# Patient Record
Sex: Female | Born: 1975
Health system: Southern US, Community
[De-identification: ages and names within clinical notes are randomized; demographics above are authoritative.]

## PROBLEM LIST (undated history)

## (undated) DIAGNOSIS — J302 Other seasonal allergic rhinitis: Secondary | ICD-10-CM

## (undated) DIAGNOSIS — I10 Essential (primary) hypertension: Secondary | ICD-10-CM

---

## 2001-11-04 ENCOUNTER — Encounter: Payer: Self-pay | Admitting: Emergency Medicine

## 2001-11-04 ENCOUNTER — Emergency Department (HOSPITAL_COMMUNITY): Admission: EM | Admit: 2001-11-04 | Discharge: 2001-11-04 | Payer: Self-pay | Admitting: Emergency Medicine

## 2004-03-02 ENCOUNTER — Emergency Department (HOSPITAL_COMMUNITY): Admission: EM | Admit: 2004-03-02 | Discharge: 2004-03-02 | Payer: Self-pay | Admitting: Emergency Medicine

## 2008-06-29 ENCOUNTER — Emergency Department (HOSPITAL_COMMUNITY): Admission: EM | Admit: 2008-06-29 | Discharge: 2008-06-29 | Payer: Self-pay | Admitting: Emergency Medicine

## 2011-02-06 ENCOUNTER — Emergency Department (HOSPITAL_COMMUNITY)
Admission: EM | Admit: 2011-02-06 | Discharge: 2011-02-07 | Disposition: A | Discharge status: Home / Self Care | Payer: Self-pay | Attending: Emergency Medicine | Admitting: Emergency Medicine

## 2011-02-06 DIAGNOSIS — Y92009 Unspecified place in unspecified non-institutional (private) residence as the place of occurrence of the external cause: Secondary | ICD-10-CM | POA: Insufficient documentation

## 2011-02-06 DIAGNOSIS — Y998 Other external cause status: Secondary | ICD-10-CM | POA: Insufficient documentation

## 2011-02-06 DIAGNOSIS — IMO0002 Reserved for concepts with insufficient information to code with codable children: Secondary | ICD-10-CM | POA: Insufficient documentation

## 2011-02-06 DIAGNOSIS — I10 Essential (primary) hypertension: Secondary | ICD-10-CM | POA: Insufficient documentation

## 2011-02-06 DIAGNOSIS — X58XXXA Exposure to other specified factors, initial encounter: Secondary | ICD-10-CM | POA: Insufficient documentation

## 2011-02-07 ENCOUNTER — Emergency Department (HOSPITAL_COMMUNITY): Payer: Self-pay

## 2011-07-06 LAB — URINALYSIS, ROUTINE W REFLEX MICROSCOPIC
Bilirubin Urine: NEGATIVE
Glucose, UA: NEGATIVE
Ketones, ur: NEGATIVE
Nitrite: NEGATIVE
Protein, ur: NEGATIVE
Specific Gravity, Urine: 1.025
Urobilinogen, UA: 0.2
pH: 7

## 2011-07-06 LAB — BASIC METABOLIC PANEL
BUN: 9
CO2: 25
Calcium: 9.2
Chloride: 106
Creatinine, Ser: 0.88
GFR calc Af Amer: >60
GFR calc non Af Amer: >60
Glucose, Bld: 120 — ABNORMAL HIGH
Potassium: 3.3 — ABNORMAL LOW
Sodium: 137

## 2011-07-06 LAB — URINE MICROSCOPIC-ADD ON

## 2011-09-16 ENCOUNTER — Encounter: Payer: Self-pay | Admitting: *Deleted

## 2011-09-16 ENCOUNTER — Emergency Department (HOSPITAL_COMMUNITY)
Admission: EM | Admit: 2011-09-16 | Discharge: 2011-09-16 | Disposition: A | Discharge status: Home / Self Care | Payer: No Typology Code available for payment source | Attending: Emergency Medicine | Admitting: Emergency Medicine

## 2011-09-16 DIAGNOSIS — M545 Low back pain, unspecified: Secondary | ICD-10-CM | POA: Insufficient documentation

## 2011-09-16 DIAGNOSIS — M542 Cervicalgia: Secondary | ICD-10-CM | POA: Insufficient documentation

## 2011-09-16 MED ORDER — IBUPROFEN 800 MG PO TABS
800.0000 mg | ORAL_TABLET | Freq: Once | ORAL | Status: AC
  Administered 2011-09-16: 800 mg via ORAL
  Filled 2011-09-16: qty 1
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
## 2011-09-16 NOTE — ED Notes (Signed)
A&ox4; in no distress; ambulatory with steady gait; medicated as ordered for pain; discharge instructions given and reviewed-verbalizes understanding of instructions given.

## 2015-04-22 ENCOUNTER — Encounter (HOSPITAL_COMMUNITY): Payer: Self-pay | Admitting: Emergency Medicine

## 2015-04-22 ENCOUNTER — Emergency Department (HOSPITAL_COMMUNITY)
Admission: EM | Admit: 2015-04-22 | Discharge: 2015-04-23 | Disposition: A | Discharge status: Home / Self Care | Payer: Self-pay | Attending: Emergency Medicine | Admitting: Emergency Medicine

## 2015-04-22 ENCOUNTER — Emergency Department (HOSPITAL_COMMUNITY): Payer: Self-pay

## 2015-04-22 DIAGNOSIS — K8050 Calculus of bile duct without cholangitis or cholecystitis without obstruction: Secondary | ICD-10-CM | POA: Insufficient documentation

## 2015-04-22 DIAGNOSIS — F419 Anxiety disorder, unspecified: Secondary | ICD-10-CM | POA: Insufficient documentation

## 2015-04-22 DIAGNOSIS — Z880 Allergy status to penicillin: Secondary | ICD-10-CM | POA: Insufficient documentation

## 2015-04-22 DIAGNOSIS — N390 Urinary tract infection, site not specified: Secondary | ICD-10-CM | POA: Insufficient documentation

## 2015-04-22 DIAGNOSIS — R0789 Other chest pain: Secondary | ICD-10-CM | POA: Insufficient documentation

## 2015-04-22 DIAGNOSIS — Z793 Long term (current) use of hormonal contraceptives: Secondary | ICD-10-CM | POA: Insufficient documentation

## 2015-04-22 DIAGNOSIS — Z3202 Encounter for pregnancy test, result negative: Secondary | ICD-10-CM | POA: Insufficient documentation

## 2015-04-22 DIAGNOSIS — R079 Chest pain, unspecified: Secondary | ICD-10-CM

## 2015-04-22 MED ORDER — MORPHINE SULFATE 4 MG/ML IJ SOLN
4.0000 mg | Freq: Once | INTRAMUSCULAR | Status: AC
  Administered 2015-04-23: 4 mg via INTRAVENOUS
  Filled 2015-04-22: qty 1

## 2015-04-22 MED ORDER — ONDANSETRON HCL 4 MG/2ML IJ SOLN
4.0000 mg | Freq: Once | INTRAMUSCULAR | Status: AC
  Administered 2015-04-23: 4 mg via INTRAVENOUS
  Filled 2015-04-22: qty 2

## 2015-04-22 NOTE — ED Notes (Signed)
Pt c/o cental chest pain that started at 2100 while she was watching tv.

## 2015-04-22 NOTE — ED Provider Notes (Signed)
CSN: [id number]     Arrival date & time 04/22/15  2330 History   First MD Initiated Contact with Patient 04/22/15 2337     Chief Complaint  Patient presents with  . Chest Pain     (Consider location/radiation/quality/duration/timing/severity/associated sxs/prior Treatment) HPI Patient presents with acute onset epigastric, central chest pain starting abruptly at 9:00 while watching television. Associated with nausea but no shortness of breath. No cough or fever. No lower extremity swelling or pain. Patient states the chest pain radiates to the right shoulder. It is not exacerbated by movement. Denies any trauma or heavy lifting. No previously similar episodes. Denied recent NSAID use. Patient states she has a family history of early myocardial infarction. States her father had an MI in his 26s. She denies smoking, hypertension or diabetes. Last ate barbecue and macaroni and cheese this evening. History reviewed. No pertinent past medical history. History reviewed. No pertinent past surgical history. History reviewed. No pertinent family history. History  Substance Use Topics  . Smoking status: Never Smoker   . Smokeless tobacco: Not on file  . Alcohol Use: No   OB History    No data available     Review of Systems  Constitutional: Negative for fever and chills.  Respiratory: Negative for cough and shortness of breath.   Cardiovascular: Positive for chest pain. Negative for leg swelling.  Gastrointestinal: Positive for nausea and abdominal pain. Negative for vomiting, diarrhea and constipation.  Musculoskeletal: Negative for myalgias, back pain, neck pain and neck stiffness.  Skin: Negative for rash and wound.  Neurological: Negative for dizziness, weakness, light-headedness, numbness and headaches.  All other systems reviewed and are negative.     Allergies  Penicillins  Home Medications   Prior to Admission medications   Medication Sig Start Date End Date Taking?  Authorizing Provider  desogestrel-ethinyl estradiol (APRI,EMOQUETTE,SOLIA) 0.15-30 MG-MCG tablet Take 1 tablet by mouth daily.      Historical Provider, MD  HYDROcodone-acetaminophen (NORCO/VICODIN) 5-325 MG per tablet Take 1-2 tablets by mouth every 4 (four) hours as needed for moderate pain or severe pain. 04/23/15   Loren Racer, MD  ondansetron (ZOFRAN) 4 MG tablet Take 1 tablet (4 mg total) by mouth every 6 (six) hours as needed for nausea or vomiting. 04/23/15   Loren Racer, MD   BP 120/76 mmHg  Pulse 59  Temp(Src) 97.7 F (36.5 C) (Oral)  Resp 15  Ht 5\' 8"  (1.727 m)  Wt 220 lb (99.791 kg)  BMI 33.46 kg/m2  SpO2 98%  LMP 03/26/2015 Physical Exam  Constitutional: She is oriented to person, place, and time. She appears well-developed and well-nourished. No distress.  Anxious appearing  HENT:  Head: Normocephalic and atraumatic.  Mouth/Throat: Oropharynx is clear and moist. No oropharyngeal exudate.  Eyes: EOM are normal. Pupils are equal, round, and reactive to light.  Neck: Normal range of motion. Neck supple.  Cardiovascular: Normal rate and regular rhythm.   Pulmonary/Chest: Effort normal and breath sounds normal. No respiratory distress. She has no wheezes. She has no rales. She exhibits tenderness (tender to palpation over the inferior mid sternum).  Abdominal: Soft. Bowel sounds are normal. She exhibits no distension and no mass. There is tenderness (right upper and epigastric tenderness with palpation.). There is no rebound and no guarding.  Musculoskeletal: Normal range of motion. She exhibits no edema or tenderness.  No CVA tenderness bilaterally. No lower extremity swelling or pain. Distal pulses intact and equal.  Neurological: She is alert and oriented to person,  place, and time.  Moves all extremities without deficit. Sensation is grossly intact.  Skin: Skin is warm and dry. No rash noted. No erythema.  Psychiatric: She has a normal mood and affect. Her behavior  is normal.  Nursing note and vitals reviewed.   ED Course  Procedures (including critical care time) Labs Review Labs Reviewed  CBC WITH DIFFERENTIAL/PLATELET - Abnormal; Notable for the following:    WBC 11.3 (*)    Lymphs Abs 4.3 (*)    All other components within normal limits  COMPREHENSIVE METABOLIC PANEL - Abnormal; Notable for the following:    Glucose, Bld 151 (*)    All other components within normal limits  URINALYSIS, ROUTINE W REFLEX MICROSCOPIC (NOT AT [facility]) - Abnormal; Notable for the following:    [facility] HAZY (*)    Hgb urine dipstick SMALL (*)    Leukocytes, UA MODERATE (*)    All other components within normal limits  D-DIMER, QUANTITATIVE (NOT [facility]) - Abnormal; Notable for the following:    D-Dimer, Quant 0.51 (*)    All other components within normal limits  URINE MICROSCOPIC-ADD ON - Abnormal; Notable for the following:    Squamous Epithelial / LPF MANY (*)    Bacteria, UA MANY (*)    All other components within normal limits  LIPASE, BLOOD  TROPONIN I  PREGNANCY, URINE  TROPONIN I    Imaging Review Dg Chest 2 View  04/23/2015   CLINICAL DATA:  39 year old female with chest pain  EXAM: CHEST  2 VIEW  COMPARISON:  None.  FINDINGS: The heart size and mediastinal contours are within normal limits. Both lungs are clear. The visualized skeletal structures are unremarkable.  IMPRESSION: No active cardiopulmonary disease.   Electronically Signed   By: Elgie CollardArash  Radparvar M.D.   On: 04/23/2015 01:29   Ct Angio Chest Pe W/cm &/or Wo Cm  04/23/2015   CLINICAL DATA:  39 year old female with chest pain  EXAM: CT ANGIOGRAPHY CHEST WITH CONTRAST  TECHNIQUE: Multidetector CT imaging of the chest was performed using the standard protocol during bolus administration of intravenous contrast. Multiplanar CT image reconstructions and MIPs were obtained to evaluate the vascular anatomy.  CONTRAST:  100mL OMNIPAQUE IOHEXOL 350 MG/ML SOLN  COMPARISON:  Radiograph 04/23/2015   FINDINGS: The lungs are clear. No pleural effusion or pneumothorax. The central airways are patent. The thoracic aorta is unremarkable. No CT evidence of pulmonary embolism. There is no lymphadenopathy. No cardiomegaly or pericardial effusion. The thyroid gland appears unremarkable. The osseous structures and thoracic wall are intact. No acute fracture.  The visualized upper abdomen appears unremarkable.  Review of the MIP images confirms the above findings.  IMPRESSION: No CT evidence of pulmonary embolism.   Electronically Signed   By: Elgie CollardArash  Radparvar M.D.   On: 04/23/2015 02:14     EKG Interpretation None     ED ECG REPORT   Date: 04/23/2015  Rate:96  Rhythm: normal sinus rhythm  QRS Axis: normal  Intervals: normal  ST/T Wave abnormalities: nonspecific T wave changes  Conduction Disutrbances:none  Narrative Interpretation:   Old EKG Reviewed: none available  I have personally reviewed the EKG tracing and agree with the computerized printout as noted.  MDM   Final diagnoses:  Chest pain  Biliary colic   Patient's chest pain and abdominal pain resolved after morphine and Zofran. Mild elevation in d-dimer. Will get CT in the chest to rule out PE. Bedside ultrasound with gallstones visible in the gallbladder. There is  no gallbladder wall thickening in her cul-de-sac fluid. Patient has a negative sonographic Murphy sign. She has normal LFTs and a normal lipase. I believe the likely cause of her symptoms are related to biliary colic. Discussed with patient the need to follow-up with a surgeon as an outpatient. Also discussed dietary changes.  CT without evidence of PE. Troponin 2 is normal. No evidence of ischemia on EKG. Patient does have gallstones with bedside ultrasound. Believe this is the likely cause of her symptoms. We'll discharge home with pain medication and follow-up with general surgery. She's been given return precautions and is voiced understanding.   Loren Racer,  MD 04/23/15 0330

## 2015-04-23 ENCOUNTER — Encounter (HOSPITAL_COMMUNITY): Payer: Self-pay | Admitting: Radiology

## 2015-04-23 ENCOUNTER — Emergency Department (HOSPITAL_COMMUNITY): Payer: Self-pay

## 2015-04-23 LAB — COMPREHENSIVE METABOLIC PANEL
ALBUMIN: 4 g/dL (ref 3.5–5.0)
ALT: 24 U/L (ref 14–54)
AST: 41 U/L (ref 15–41)
Alkaline Phosphatase: 93 U/L (ref 38–126)
Anion gap: 7 (ref 5–15)
BUN: 12 mg/dL (ref 6–20)
CHLORIDE: 101 mmol/L (ref 101–111)
CO2: 28 mmol/L (ref 22–32)
Calcium: 8.9 mg/dL (ref 8.9–10.3)
Creatinine, Ser: 0.89 mg/dL (ref 0.44–1.00)
GFR calc Af Amer: >60 mL/min (ref >60)
GFR calc non Af Amer: >60 mL/min (ref >60)
GLUCOSE: 151 mg/dL — AB (ref 65–99)
POTASSIUM: 3.6 mmol/L (ref 3.5–5.1)
Sodium: 136 mmol/L (ref 135–145)
Total Bilirubin: 0.7 mg/dL (ref 0.3–1.2)
Total Protein: 7.8 g/dL (ref 6.5–8.1)

## 2015-04-23 LAB — URINALYSIS, ROUTINE W REFLEX MICROSCOPIC
Bilirubin Urine: NEGATIVE
Glucose, UA: NEGATIVE mg/dL
Ketones, ur: NEGATIVE mg/dL
Nitrite: NEGATIVE
PH: 6.5 (ref 5.0–8.0)
PROTEIN: NEGATIVE mg/dL
Specific Gravity, Urine: 1.025 (ref 1.005–1.030)
UROBILINOGEN UA: 0.2 mg/dL (ref 0.0–1.0)

## 2015-04-23 LAB — CBC WITH DIFFERENTIAL/PLATELET
Basophils Absolute: 0 10*3/uL (ref 0.0–0.1)
Basophils Relative: 0 % (ref 0–1)
EOS ABS: 0.2 10*3/uL (ref 0.0–0.7)
Eosinophils Relative: 2 % (ref 0–5)
HEMATOCRIT: 42.3 % (ref 36.0–46.0)
Hemoglobin: 14.3 g/dL (ref 12.0–15.0)
LYMPHS ABS: 4.3 10*3/uL — AB (ref 0.7–4.0)
Lymphocytes Relative: 38 % (ref 12–46)
MCH: 31.3 pg (ref 26.0–34.0)
MCHC: 33.8 g/dL (ref 30.0–36.0)
MCV: 92.6 fL (ref 78.0–100.0)
MONOS PCT: 9 % (ref 3–12)
Monocytes Absolute: 1 10*3/uL (ref 0.1–1.0)
NEUTROS PCT: 51 % (ref 43–77)
Neutro Abs: 5.8 10*3/uL (ref 1.7–7.7)
PLATELETS: 315 10*3/uL (ref 150–400)
RBC: 4.57 MIL/uL (ref 3.87–5.11)
RDW: 12.3 % (ref 11.5–15.5)
WBC: 11.3 10*3/uL — ABNORMAL HIGH (ref 4.0–10.5)

## 2015-04-23 LAB — URINE MICROSCOPIC-ADD ON

## 2015-04-23 LAB — LIPASE, BLOOD: LIPASE: 26 U/L (ref 22–51)

## 2015-04-23 LAB — D-DIMER, QUANTITATIVE: D-Dimer, Quant: 0.51 ug/mL-FEU — ABNORMAL HIGH (ref 0.00–0.48)

## 2015-04-23 LAB — PREGNANCY, URINE: Preg Test, Ur: NEGATIVE

## 2015-04-23 LAB — TROPONIN I
Troponin I: <0.03 ng/mL (ref <0.031)
Troponin I: <0.03 ng/mL (ref <0.031)

## 2015-04-23 MED ORDER — HYDROCODONE-ACETAMINOPHEN 5-325 MG PO TABS: 1.0000 | ORAL_TABLET | ORAL | Status: DC | PRN

## 2015-04-23 MED ORDER — MORPHINE SULFATE 4 MG/ML IJ SOLN
4.0000 mg | Freq: Once | INTRAMUSCULAR | Status: AC
  Administered 2015-04-23: 4 mg via INTRAVENOUS
  Filled 2015-04-23: qty 1

## 2015-04-23 MED ORDER — ONDANSETRON HCL 4 MG PO TABS: 4.0000 mg | ORAL_TABLET | Freq: Four times a day (QID) | ORAL | Status: DC | PRN

## 2015-04-23 MED ORDER — NITROFURANTOIN MONOHYD MACRO 100 MG PO CAPS: 100.0000 mg | ORAL_CAPSULE | Freq: Two times a day (BID) | ORAL | Status: DC

## 2015-04-23 MED ORDER — IOHEXOL 350 MG/ML SOLN
100.0000 mL | Freq: Once | INTRAVENOUS | Status: AC | PRN
  Administered 2015-04-23: 100 mL via INTRAVENOUS

## 2015-04-23 NOTE — Discharge Instructions (Signed)
Biliary Colic  °Biliary colic is a steady or irregular pain in the upper abdomen. It is usually under the right side of the rib cage. It happens when gallstones interfere with the normal flow of bile from the gallbladder. Bile is a liquid that helps to digest fats. Bile is made in the liver and stored in the gallbladder. When you eat a meal, bile passes from the gallbladder through the cystic duct and the common bile duct into the small intestine. There, it mixes with partially digested food. If a gallstone blocks either of these ducts, the normal flow of bile is blocked. The muscle cells in the bile duct contract forcefully to try to move the stone. This causes the pain of biliary colic.  °SYMPTOMS  °· A person with biliary colic usually complains of pain in the upper abdomen. This pain can be: °· In the center of the upper abdomen just below the breastbone. °· In the upper-right part of the abdomen, near the gallbladder and liver. °· Spread back toward the right shoulder blade. °· Nausea and vomiting. °· The pain usually occurs after eating. °· Biliary colic is usually triggered by the digestive system's demand for bile. The demand for bile is high after fatty meals. Symptoms can also occur when a person who has been fasting suddenly eats a very large meal. Most episodes of biliary colic pass after 1 to 5 hours. After the most intense pain passes, your abdomen may continue to ache mildly for about 24 hours. °DIAGNOSIS  °After you describe your symptoms, your caregiver will perform a physical exam. He or she will pay attention to the upper right portion of your belly (abdomen). This is the area of your liver and gallbladder. An ultrasound will help your caregiver look for gallstones. Specialized scans of the gallbladder may also be done. Blood tests may be done, especially if you have fever or if your pain persists. °PREVENTION  °Biliary colic can be prevented by controlling the risk factors for gallstones. Some of  these risk factors, such as heredity, increasing age, and pregnancy are a normal part of life. Obesity and a high-fat diet are risk factors you can change through a healthy lifestyle. Women going through menopause who take hormone replacement therapy (estrogen) are also more likely to develop biliary colic. °TREATMENT  °· Pain medication may be prescribed. °· You may be encouraged to eat a fat-free diet. °· If the first episode of biliary colic is severe, or episodes of colic keep retuning, surgery to remove the gallbladder (cholecystectomy) is usually recommended. This procedure can be done through small incisions using an instrument called a laparoscope. The procedure often requires a brief stay in the hospital. Some people can leave the hospital the same day. It is the most widely used treatment in people troubled by painful gallstones. It is effective and safe, with no complications in more than 90% of cases. °· If surgery cannot be done, medication that dissolves gallstones may be used. This medication is expensive and can take months or years to work. Only small stones will dissolve. °· Rarely, medication to dissolve gallstones is combined with a procedure called shock-wave lithotripsy. This procedure uses carefully aimed shock waves to break up gallstones. In many people treated with this procedure, gallstones form again within a few years. °PROGNOSIS  °If gallstones block your cystic duct or common bile duct, you are at risk for repeated episodes of biliary colic. There is also a 25% chance that you will develop   a gallbladder infection(acute cholecystitis), or some other complication of gallstones within 10 to 20 years. If you have surgery, schedule it at a time that is convenient for you and at a time when you are not sick. HOME CARE INSTRUCTIONS   Drink plenty of clear fluids.  Avoid fatty, greasy or fried foods, or any foods that make your pain worse.  Take medications as directed. SEEK MEDICAL  CARE IF:   You develop a fever over 100.5 F (38.1 C).  Your pain gets worse over time.  You develop nausea that prevents you from eating and drinking.  You develop vomiting. SEEK IMMEDIATE MEDICAL CARE IF:   You have continuous or severe belly (abdominal) pain which is not relieved with medications.  You develop nausea and vomiting which is not relieved with medications.  You have symptoms of biliary colic and you suddenly develop a fever and shaking chills. This may signal cholecystitis. Call your caregiver immediately.  You develop a yellow color to your skin or the white part of your eyes (jaundice). Document Released: 02/22/2006 Document Revised: 12/14/2011 Document Reviewed: 05/03/2008 [facility] Patient Information 2015 [place], [place]. This information is not intended to replace advice given to you by your health care provider. Make sure you discuss any questions you have with your health care provider.  Chest Pain (Nonspecific) It is often hard to give a specific diagnosis for the cause of chest pain. There is always a chance that your pain could be related to something serious, such as a heart attack or a blood clot in the lungs. You need to follow up with your health care provider for further evaluation. CAUSES   Heartburn.  Pneumonia or bronchitis.  Anxiety or stress.  Inflammation around your heart (pericarditis) or lung (pleuritis or pleurisy).  A blood clot in the lung.  A collapsed lung (pneumothorax). It can develop suddenly on its own (spontaneous pneumothorax) or from trauma to the chest.  Shingles infection (herpes zoster virus). The chest wall is composed of bones, muscles, and cartilage. Any of these can be the source of the pain.  The bones can be bruised by injury.  The muscles or cartilage can be strained by coughing or overwork.  The cartilage can be affected by inflammation and become sore (costochondritis). DIAGNOSIS  Lab tests or other studies  may be needed to find the cause of your pain. Your health care provider may have you take a test called an ambulatory electrocardiogram (ECG). An ECG records your heartbeat patterns over a 24-hour period. You may also have other tests, such as:  Transthoracic echocardiogram (TTE). During echocardiography, sound waves are used to evaluate how blood flows through your heart.  Transesophageal echocardiogram (TEE).  Cardiac monitoring. This allows your health care provider to monitor your heart rate and rhythm in real time.  Holter monitor. This is a portable device that records your heartbeat and can help diagnose heart arrhythmias. It allows your health care provider to track your heart activity for several days, if needed.  Stress tests by exercise or by giving medicine that makes the heart beat faster. TREATMENT   Treatment depends on what may be causing your chest pain. Treatment may include:  Acid blockers for heartburn.  Anti-inflammatory medicine.  Pain medicine for inflammatory conditions.  Antibiotics if an infection is present.  You may be advised to change lifestyle habits. This includes stopping smoking and avoiding alcohol, caffeine, and chocolate.  You may be advised to keep your head raised (elevated) when sleeping.  This reduces the chance of acid going backward from your stomach into your esophagus. Most of the time, nonspecific chest pain will improve within 2-3 days with rest and mild pain medicine.  HOME CARE INSTRUCTIONS   If antibiotics were prescribed, take them as directed. Finish them even if you start to feel better.  For the next few days, avoid physical activities that bring on chest pain. Continue physical activities as directed.  Do not use any tobacco products, including cigarettes, chewing tobacco, or electronic cigarettes.  Avoid drinking alcohol.  Only take medicine as directed by your health care provider.  Follow your health care provider's  suggestions for further testing if your chest pain does not go away.  Keep any follow-up appointments you made. If you do not go to an appointment, you could develop lasting (chronic) problems with pain. If there is any problem keeping an appointment, call to reschedule. SEEK MEDICAL CARE IF:   Your chest pain does not go away, even after treatment.  You have a rash with blisters on your chest.  You have a fever. SEEK IMMEDIATE MEDICAL CARE IF:   You have increased chest pain or pain that spreads to your arm, neck, jaw, back, or abdomen.  You have shortness of breath.  You have an increasing cough, or you cough up blood.  You have severe back or abdominal pain.  You feel nauseous or vomit.  You have severe weakness.  You faint.  You have chills. This is an emergency. Do not wait to see if the pain will go away. Get medical help at once. Call your local emergency services (911 in [place] Do not drive yourself to the hospital. MAKE SURE YOU:   Understand these instructions.  Will watch your condition.  Will get help right away if you are not doing well or get worse. Document Released: 07/01/2005 Document Revised: 09/26/2013 Document Reviewed: 04/26/2008 [facility] Patient Information 2015 [place]. This information is not intended to replace advice given to you by your health care provider. Make sure you discuss any questions you have with your health care provider.

## 2015-05-02 ENCOUNTER — Other Ambulatory Visit (HOSPITAL_COMMUNITY): Payer: Self-pay | Admitting: Orthopedic Surgery

## 2015-05-02 DIAGNOSIS — R1011 Right upper quadrant pain: Secondary | ICD-10-CM

## 2015-05-08 ENCOUNTER — Ambulatory Visit (HOSPITAL_COMMUNITY)
Admission: RE | Admit: 2015-05-08 | Discharge: 2015-05-08 | Disposition: A | Discharge status: Home / Self Care | Payer: No Typology Code available for payment source | Source: Ambulatory Visit | Attending: Orthopedic Surgery | Admitting: Orthopedic Surgery

## 2015-05-08 DIAGNOSIS — K8020 Calculus of gallbladder without cholecystitis without obstruction: Secondary | ICD-10-CM | POA: Insufficient documentation

## 2015-05-08 DIAGNOSIS — R1011 Right upper quadrant pain: Secondary | ICD-10-CM

## 2015-05-16 NOTE — H&P (Signed)
  NTS SOAP Note  Vital Signs:  Vitals as of: 05/02/2015: Systolic 150: Diastolic 90: Heart Rate 77: Temp 98.80F: Height 23ft 8in: Weight 218Lbs 0 Ounces: Pain Level 4: BMI 33.15  BMI : 33.15 kg/m2  Subjective: This 39 year old female presents for of upper abdominal pain.  Seen in ER recently for severe chest pain and reflux.  CT of chest negative for PE.  Found on bedside u/s to have gallstones.  LFT's and lipase negative.  Patient states she has h/o intermittent right upper quadrant abdominal pain, nausea, bloating, and fatty food intolerance.  No fever, chills, jaundice.  Review of Symptoms:  Constitutional:unremarkable   headache Eyes:unremarkable   sinus problems Cardiovascular:  unremarkable Respiratory:unremarkable Gastrointestinabdominal pain, nausea, heartburn, dyspepsia Genitourinary:unremarkable   Musculoskeletal:back pain Skin:unremarkable Hematolgic/Lymphatic:unremarkable   Allergic/Immunologic:unremarkable   Past Medical History:  Reviewed  Past Medical History  Surgical History: none Medical Problems: none Allergies: PCN? Medications: BCP, claritin   Social History:Reviewed  Social History  Preferred Language: English Race:  White Ethnicity: Not Hispanic / Latino Age: 16 year Marital Status:  S Alcohol: no   Smoking Status: Never smoker reviewed on 05/02/2015 Functional Status reviewed on 05/02/2015 ------------------------------------------------ Bathing: Normal Cooking: Normal Dressing: Normal Driving: Normal Eating: Normal Managing Meds: Normal Oral Care: Normal Shopping: Normal Toileting: Normal Transferring: Normal Walking: Normal Cognitive Status reviewed on 05/02/2015 ------------------------------------------------ Attention: Normal Decision Making: Normal Language: Normal Memory: Normal Motor: Normal Perception: Normal Problem Solving: Normal Visual and Spatial: Normal   Family History:Reviewed  Family  Health History Mother, Deceased; Breast cancer;  Father, Living; Heart attack (myocardial infarction);     Objective Information: General:Well appearing, well nourished in no distress. no scleral icterus Heart:RRR, no murmur Lungs:  CTA bilaterally, no wheezes, rhonchi, rales.  Breathing unlabored. Abdomen:Soft, NT/ND, no HSM, no masses.  Assessment:Abdominal pain  Diagnoses: 574.20  K80.20 Gallstone (Calculus of gallbladder without cholecystitis without obstruction)  Procedures: 45409 - OFFICE OUTPATIENT NEW 30 MINUTES    Plan:  Scheduled for laparoscopic cholecystectomy on 05/22/15.  Risks and benefits of procedure including bleeding, infection, hepatobiliary injury, and the possibility of an procedure were fully explained to the patient, who gives informed consent.

## 2015-05-17 NOTE — Patient Instructions (Signed)
Your procedure is scheduled on: 05/22/2015  Report to [facility] at  6:15   AM.  Call this number if you have problems the morning of surgery: [phone]   Remember:   Do not drink or eat food:After Midnight.  :  Take these medicines the morning of surgery with A SIP OF WATER: Claratin   Do not wear jewelry, make-up or nail polish.  Do not wear lotions, powders, or perfumes. You may wear deodorant.  Do not shave 48 hours prior to surgery. Men may shave face and neck.  Do not bring valuables to the hospital.  Contacts, dentures or bridgework may not be worn into surgery.  Leave suitcase in the car. After surgery it may be brought to your room.  For patients admitted to the hospital, checkout time is 11:00 AM the day of discharge.   Patients discharged the day of surgery will not be allowed to drive home.    Special Instructions: Shower using CHG night before surgery and shower the day of surgery use CHG.  Use special wash - you have one bottle of CHG for all showers.  You should use approximately 1/2 of the bottle for each shower.   Please read over the following fact sheets that you were given: Pain Booklet, MRSA Information, Surgical Site Infection Prevention and Care and Recovery After Surgery  Laparoscopic Cholecystectomy, Care After Refer to this sheet in the next few weeks. These instructions provide you with information on caring for yourself after your procedure. Your health care provider may also give you more specific instructions. Your treatment has been planned according to current medical practices, but problems sometimes occur. Call your health care provider if you have any problems or questions after your procedure. WHAT TO EXPECT AFTER THE PROCEDURE After your procedure, it is typical to have the following:  Pain at your incision sites. You will be given pain medicines to control the pain.  Mild nausea or vomiting. This should improve after the first 24  hours.  Bloating and possibly shoulder pain from the gas used during the procedure. This will improve after the first 24 hours. HOME CARE INSTRUCTIONS   Change bandages (dressings) as directed by your health care provider.  Keep the wound dry and clean. You may wash the wound gently with soap and water. Gently blot or dab the area dry.  Do not take baths or use swimming pools or hot tubs for 2 weeks or until your health care provider approves.  Only take over-the-counter or prescription medicines as directed by your health care provider.  Continue your normal diet as directed by your health care provider.  Do not lift anything heavier than 10 pounds (4.5 kg) until your health care provider approves.  Do not play contact sports for 1 week or until your health care provider approves. SEEK MEDICAL CARE IF:   You have redness, swelling, or increasing pain in the wound.  You notice yellowish-white fluid (pus) coming from the wound.  You have drainage from the wound that lasts longer than 1 day.  You notice a bad smell coming from the wound or dressing.  Your surgical cuts (incisions) break open. SEEK IMMEDIATE MEDICAL CARE IF:   You develop a rash.  You have difficulty breathing.  You have chest pain.  You have a fever.  You have increasing pain in the shoulders (shoulder strap areas).  You have dizzy episodes or faint while standing.  You have severe abdominal pain.  You feel  sick to your stomach (nauseous) or throw up (vomit) and this lasts for more than 1 day. Document Released: 09/21/2005 Document Revised: 07/12/2013 Document Reviewed: 05/03/2013 [facility] Patient Information 2015 [place], [place]. This information is not intended to replace advice given to you by your health care provider. Make sure you discuss any questions you have with your health care provider. General Anesthesia, Care After Refer to this sheet in the next few weeks. These instructions provide you  with information on caring for yourself after your procedure. Your health care provider may also give you more specific instructions. Your treatment has been planned according to current medical practices, but problems sometimes occur. Call your health care provider if you have any problems or questions after your procedure. WHAT TO EXPECT AFTER THE PROCEDURE After the procedure, it is typical to experience:  Sleepiness.  Nausea and vomiting. HOME CARE INSTRUCTIONS  For the first 24 hours after general anesthesia:  Have a responsible person with you.  Do not drive a car. If you are alone, do not take public transportation.  Do not drink alcohol.  Do not take medicine that has not been prescribed by your health care provider.  Do not sign important papers or make important decisions.  You may resume a normal diet and activities as directed by your health care provider.  Change bandages (dressings) as directed.  If you have questions or problems that seem related to general anesthesia, call the hospital and ask for the anesthetist or anesthesiologist on call. SEEK MEDICAL CARE IF:  You have nausea and vomiting that continue the day after anesthesia.  You develop a rash. SEEK IMMEDIATE MEDICAL CARE IF:   You have difficulty breathing.  You have chest pain.  You have any allergic problems. Document Released: 12/28/2000 Document Revised: 09/26/2013 Document Reviewed: 04/06/2013 [facility] Patient Information 2015 [place], [place]. This information is not intended to replace advice given to you by your health care provider. Make sure you discuss any questions you have with your health care provider.

## 2015-05-20 ENCOUNTER — Encounter (HOSPITAL_COMMUNITY)
Admission: RE | Admit: 2015-05-20 | Discharge: 2015-05-20 | Disposition: A | Discharge status: Home / Self Care | Payer: Self-pay | Source: Ambulatory Visit | Attending: General Surgery | Admitting: General Surgery

## 2015-05-20 ENCOUNTER — Encounter (HOSPITAL_COMMUNITY): Payer: Self-pay

## 2015-05-20 DIAGNOSIS — Z01818 Encounter for other preprocedural examination: Secondary | ICD-10-CM | POA: Insufficient documentation

## 2015-05-20 DIAGNOSIS — K8020 Calculus of gallbladder without cholecystitis without obstruction: Secondary | ICD-10-CM | POA: Insufficient documentation

## 2015-05-20 HISTORY — DX: Other seasonal allergic rhinitis: J30.2

## 2015-05-20 LAB — HCG, SERUM, QUALITATIVE: Preg, Serum: NEGATIVE

## 2015-05-22 ENCOUNTER — Ambulatory Visit (HOSPITAL_COMMUNITY): Payer: Self-pay | Admitting: Anesthesiology

## 2015-05-22 ENCOUNTER — Ambulatory Visit (HOSPITAL_COMMUNITY)
Admission: RE | Admit: 2015-05-22 | Discharge: 2015-05-22 | Disposition: A | Discharge status: Home / Self Care | Payer: Self-pay | Source: Ambulatory Visit | Attending: General Surgery | Admitting: General Surgery

## 2015-05-22 ENCOUNTER — Encounter (HOSPITAL_COMMUNITY): Payer: Self-pay | Admitting: *Deleted

## 2015-05-22 ENCOUNTER — Encounter (HOSPITAL_COMMUNITY)
Admission: RE | Disposition: A | Discharge status: Home / Self Care | Payer: Self-pay | Source: Ambulatory Visit | Attending: General Surgery

## 2015-05-22 DIAGNOSIS — K8010 Calculus of gallbladder with chronic cholecystitis without obstruction: Secondary | ICD-10-CM | POA: Insufficient documentation

## 2015-05-22 HISTORY — PX: CHOLECYSTECTOMY: SHX55

## 2015-05-22 LAB — GLUCOSE, CAPILLARY: GLUCOSE-CAPILLARY: 102 mg/dL — AB (ref 65–99)

## 2015-05-22 SURGERY — LAPAROSCOPIC CHOLECYSTECTOMY
Anesthesia: General | Site: Abdomen

## 2015-05-22 MED ORDER — PROPOFOL 10 MG/ML IV BOLUS
INTRAVENOUS | Status: AC
  Filled 2015-05-22: qty 20

## 2015-05-22 MED ORDER — POVIDONE-IODINE 10 % EX OINT
TOPICAL_OINTMENT | CUTANEOUS | Status: AC
  Filled 2015-05-22: qty 1

## 2015-05-22 MED ORDER — SODIUM CHLORIDE 0.9 % IR SOLN
Status: DC | PRN
  Administered 2015-05-22: 1000 mL

## 2015-05-22 MED ORDER — HEMOSTATIC AGENTS (NO CHARGE) OPTIME
TOPICAL | Status: DC | PRN
  Administered 2015-05-22: 1 via TOPICAL

## 2015-05-22 MED ORDER — LIDOCAINE HCL 1 % IJ SOLN
INTRAMUSCULAR | Status: DC | PRN
  Administered 2015-05-22: 30 mg via INTRADERMAL

## 2015-05-22 MED ORDER — CIPROFLOXACIN IN D5W 400 MG/200ML IV SOLN
400.0000 mg | INTRAVENOUS | Status: AC
  Administered 2015-05-22: 400 mg via INTRAVENOUS
  Filled 2015-05-22: qty 200

## 2015-05-22 MED ORDER — LACTATED RINGERS IV SOLN
INTRAVENOUS | Status: DC
  Administered 2015-05-22: 1000 mL via INTRAVENOUS
  Administered 2015-05-22: 08:00:00 via INTRAVENOUS

## 2015-05-22 MED ORDER — LIDOCAINE HCL (PF) 1 % IJ SOLN
INTRAMUSCULAR | Status: AC
  Filled 2015-05-22: qty 5

## 2015-05-22 MED ORDER — MIDAZOLAM HCL 5 MG/5ML IJ SOLN
INTRAMUSCULAR | Status: DC | PRN
  Administered 2015-05-22: 2 mg via INTRAVENOUS

## 2015-05-22 MED ORDER — FENTANYL CITRATE (PF) 100 MCG/2ML IJ SOLN: 25.0000 ug | INTRAMUSCULAR | Status: DC | PRN

## 2015-05-22 MED ORDER — MIDAZOLAM HCL 2 MG/2ML IJ SOLN
INTRAMUSCULAR | Status: AC
  Filled 2015-05-22: qty 4

## 2015-05-22 MED ORDER — ROCURONIUM BROMIDE 100 MG/10ML IV SOLN
INTRAVENOUS | Status: DC | PRN
  Administered 2015-05-22: 10 mg via INTRAVENOUS
  Administered 2015-05-22: 30 mg via INTRAVENOUS

## 2015-05-22 MED ORDER — DEXTROSE 5 % IV SOLN
INTRAVENOUS | Status: DC | PRN
  Administered 2015-05-22: 08:00:00 via INTRAVENOUS

## 2015-05-22 MED ORDER — FENTANYL CITRATE (PF) 250 MCG/5ML IJ SOLN
INTRAMUSCULAR | Status: AC
  Filled 2015-05-22: qty 25

## 2015-05-22 MED ORDER — BUPIVACAINE HCL (PF) 0.5 % IJ SOLN
INTRAMUSCULAR | Status: DC | PRN
  Administered 2015-05-22: 10 mL

## 2015-05-22 MED ORDER — FENTANYL CITRATE (PF) 100 MCG/2ML IJ SOLN
INTRAMUSCULAR | Status: DC | PRN
  Administered 2015-05-22 (×2): 50 ug via INTRAVENOUS
  Administered 2015-05-22: 100 ug via INTRAVENOUS

## 2015-05-22 MED ORDER — DEXAMETHASONE SODIUM PHOSPHATE 4 MG/ML IJ SOLN
4.0000 mg | Freq: Once | INTRAMUSCULAR | Status: AC
  Administered 2015-05-22: 4 mg via INTRAVENOUS
  Filled 2015-05-22: qty 1

## 2015-05-22 MED ORDER — POVIDONE-IODINE 10 % OINT PACKET
TOPICAL_OINTMENT | CUTANEOUS | Status: DC | PRN
  Administered 2015-05-22: 1 via TOPICAL

## 2015-05-22 MED ORDER — NEOSTIGMINE METHYLSULFATE 10 MG/10ML IV SOLN
INTRAVENOUS | Status: DC | PRN
  Administered 2015-05-22: 2 mg via INTRAVENOUS

## 2015-05-22 MED ORDER — MIDAZOLAM HCL 2 MG/2ML IJ SOLN
1.0000 mg | INTRAMUSCULAR | Status: DC | PRN
  Administered 2015-05-22: 2 mg via INTRAVENOUS
  Filled 2015-05-22: qty 2

## 2015-05-22 MED ORDER — KETOROLAC TROMETHAMINE 30 MG/ML IJ SOLN: 30.0000 mg | Freq: Once | INTRAMUSCULAR | Status: DC

## 2015-05-22 MED ORDER — FENTANYL CITRATE (PF) 250 MCG/5ML IJ SOLN
INTRAMUSCULAR | Status: AC
Start: 2015-05-22 — End: 2015-05-22
  Filled 2015-05-22: qty 25

## 2015-05-22 MED ORDER — BUPIVACAINE HCL (PF) 0.5 % IJ SOLN
INTRAMUSCULAR | Status: AC
  Filled 2015-05-22: qty 30

## 2015-05-22 MED ORDER — CHLORHEXIDINE GLUCONATE 4 % EX LIQD: 1.0000 "application " | Freq: Once | CUTANEOUS | Status: DC

## 2015-05-22 MED ORDER — ROCURONIUM BROMIDE 50 MG/5ML IV SOLN
INTRAVENOUS | Status: AC
  Filled 2015-05-22: qty 1

## 2015-05-22 MED ORDER — ONDANSETRON HCL 4 MG/2ML IJ SOLN
4.0000 mg | Freq: Once | INTRAMUSCULAR | Status: AC
  Administered 2015-05-22: 4 mg via INTRAVENOUS
  Filled 2015-05-22: qty 2

## 2015-05-22 MED ORDER — HYDROCODONE-ACETAMINOPHEN 5-325 MG PO TABS: 1.0000 | ORAL_TABLET | ORAL | Status: DC | PRN

## 2015-05-22 MED ORDER — PROPOFOL 10 MG/ML IV BOLUS
INTRAVENOUS | Status: DC | PRN
  Administered 2015-05-22: 160 mg via INTRAVENOUS

## 2015-05-22 MED ORDER — GLYCOPYRROLATE 0.2 MG/ML IJ SOLN
INTRAMUSCULAR | Status: DC | PRN
  Administered 2015-05-22: 0.6 mg via INTRAVENOUS

## 2015-05-22 MED ORDER — ONDANSETRON HCL 4 MG/2ML IJ SOLN: 4.0000 mg | Freq: Once | INTRAMUSCULAR | Status: DC | PRN

## 2015-05-22 SURGICAL SUPPLY — 41 items
APPLIER CLIP LAPSCP 10X32 DD (CLIP) ×2 IMPLANT
BAG HAMPER (MISCELLANEOUS) ×2 IMPLANT
CHLORAPREP W/TINT 26ML (MISCELLANEOUS) ×2 IMPLANT
CLOTH BEACON ORANGE TIMEOUT ST (SAFETY) ×2 IMPLANT
COVER LIGHT HANDLE STERIS (MISCELLANEOUS) ×4 IMPLANT
DECANTER SPIKE VIAL GLASS SM (MISCELLANEOUS) ×2 IMPLANT
ELECT REM PT RETURN 9FT ADLT (ELECTROSURGICAL) ×2
ELECTRODE REM PT RTRN 9FT ADLT (ELECTROSURGICAL) ×1 IMPLANT
FILTER SMOKE EVAC LAPAROSHD (FILTER) ×2 IMPLANT
FORMALIN 10 PREFIL 120ML (MISCELLANEOUS) ×2 IMPLANT
GLOVE BIOGEL M 7.0 STRL (GLOVE) ×4 IMPLANT
GLOVE BIOGEL PI IND STRL 7.0 (GLOVE) ×3 IMPLANT
GLOVE BIOGEL PI INDICATOR 7.0 (GLOVE) ×3
GLOVE EXAM NITRILE MD LF STRL (GLOVE) ×2 IMPLANT
GLOVE SURG SS PI 7.5 STRL IVOR (GLOVE) ×2 IMPLANT
GOWN STRL REUS W/ TWL XL LVL3 (GOWN DISPOSABLE) ×1 IMPLANT
GOWN STRL REUS W/TWL LRG LVL3 (GOWN DISPOSABLE) ×4 IMPLANT
GOWN STRL REUS W/TWL XL LVL3 (GOWN DISPOSABLE) ×1
HEMOSTAT SNOW SURGICEL 2X4 (HEMOSTASIS) ×2 IMPLANT
INST SET LAPROSCOPIC AP (KITS) ×2 IMPLANT
IV NS IRRIG 3000ML ARTHROMATIC (IV SOLUTION) IMPLANT
KIT ROOM TURNOVER APOR (KITS) ×2 IMPLANT
MANIFOLD NEPTUNE II (INSTRUMENTS) ×2 IMPLANT
NEEDLE INSUFFLATION 14GA 120MM (NEEDLE) ×2 IMPLANT
NS IRRIG 1000ML POUR BTL (IV SOLUTION) ×2 IMPLANT
PACK LAP CHOLE LZT030E (CUSTOM PROCEDURE TRAY) ×2 IMPLANT
PAD ARMBOARD 7.5X6 YLW CONV (MISCELLANEOUS) ×2 IMPLANT
POUCH SPECIMEN RETRIEVAL 10MM (ENDOMECHANICALS) ×2 IMPLANT
SET BASIN LINEN APH (SET/KITS/TRAYS/PACK) ×2 IMPLANT
SET TUBE IRRIG SUCTION NO TIP (IRRIGATION / IRRIGATOR) IMPLANT
SLEEVE ENDOPATH XCEL 5M (ENDOMECHANICALS) ×2 IMPLANT
SPONGE GAUZE 2X2 8PLY STRL LF (GAUZE/BANDAGES/DRESSINGS) ×8 IMPLANT
STAPLER VISISTAT (STAPLE) ×2 IMPLANT
SUT VICRYL 0 UR6 27IN ABS (SUTURE) ×2 IMPLANT
TAPE CLOTH SURG 4X10 WHT LF (GAUZE/BANDAGES/DRESSINGS) ×2 IMPLANT
TROCAR ENDO BLADELESS 11MM (ENDOMECHANICALS) ×2 IMPLANT
TROCAR XCEL NON-BLD 5MMX100MML (ENDOMECHANICALS) ×2 IMPLANT
TROCAR XCEL UNIV SLVE 11M 100M (ENDOMECHANICALS) ×2 IMPLANT
TUBING INSUFFLATION (TUBING) ×2 IMPLANT
WARMER LAPAROSCOPE (MISCELLANEOUS) ×2 IMPLANT
YANKAUER SUCT 12FT TUBE ARGYLE (SUCTIONS) ×2 IMPLANT

## 2015-05-22 NOTE — Op Note (Signed)
Patient:  Amy Durham  DOB:  [birth year]  MRN:  [id number]   Preop Diagnosis:  Cholecystitis, cholelithiasis  Postop Diagnosis:  Same  Procedure:  Laparoscopic cholecystectomy  Surgeon:  Franky Macho, M.D.  Anes:  Gen. endotracheal  Indications:  Patient is a 39 year old white female who presents with biliary colic secondary to cholelithiasis. The risks and benefits of the procedure including bleeding, infection, hepatobiliary injury, and the possibility of an open procedure were fully explained to the patient, who gave informed consent.  Procedure note:  The patient was placed in the supine position. After induction of general endotracheal anesthesia, the abdomen was prepped and draped using the usual sterile technique with DuraPrep. Surgical site confirmation was performed.  The supraumbilical incision was made down to the fascia. A Veress needle was introduced into the abdominal cavity and confirmation of placement was done using the saline drop test. The abdomen was then insufflated to 16 mmHg pressure. An 11 mm trocar was introduced into the abdominal cavity under direct visualization without difficulty. The patient was placed in reverse Trendelenburg position and additional 11 mm trocar was placed the epigastric region 5 mm trochars were placed the right upper quadrant and right flank regions. The liver was inspected and noted to be within normal limits. The gallbladder was retracted in a dynamic fashion in order to expose the triangle of Calot. The cystic duct was first identified. Its juncture to the infundibulum was fully identified. Endoclips were placed proximally and distally on the cystic duct, and the cystic duct was divided. This was likewise done to the cystic artery. The gallbladder was freed away from the gallbladder fossa using Bovie electrocautery. The gallbladder was delivered through the epigastric trocar site using an Endo Catch bag. The gallbladder fossa was inspected and  no abnormal bleeding or bile leakage was noted. Surgicel was placed in the gallbladder fossa. All fluid and air were then evacuated from the abdominal cavity prior to removal of the trochars.  All wounds were irrigated with normal saline. All wounds were injected with 0.5% Sensorcaine. The supraumbilical fascia was reapproximated using 0 Vicryl interrupted sutures. All skin incisions were closed using staples. Betadine ointment and dry sterile dressings were applied.  All tape and needle counts were correct at the end of the procedure. Patient was extubated in the operating room and transferred to PACU in stable condition.  Complications:  None  EBL:  Minimal  Specimen:  Gallbladder

## 2015-05-22 NOTE — OR Nursing (Signed)
EPIC was down when patient arrived to PACU. Please see paper chart for PACU care.

## 2015-05-22 NOTE — Interval H&P Note (Signed)
History and Physical Interval Note:  05/22/2015 7:20 AM  Amy Durham  has presented today for surgery, with the diagnosis of cholelithiasis  The various methods of treatment have been discussed with the patient and family. After consideration of risks, benefits and other options for treatment, the patient has consented to  Procedure(s): LAPAROSCOPIC CHOLECYSTECTOMY (N/A) as a surgical intervention .  The patient's history has been reviewed, patient examined, no change in status, stable for surgery.  I have reviewed the patient's chart and labs.  Questions were answered to the patient's satisfaction.     Franky Macho A

## 2015-05-22 NOTE — Discharge Instructions (Signed)

## 2015-05-22 NOTE — Anesthesia Procedure Notes (Signed)
Procedure Name: Intubation Date/Time: 05/22/2015 7:43 AM Performed by: Despina Hidden Pre-anesthesia Checklist: Suction available, Patient being monitored, Emergency Drugs available and Patient identified Patient Re-evaluated:Patient Re-evaluated prior to inductionOxygen Delivery Method: Circle system utilized Preoxygenation: Pre-oxygenation with 100% oxygen Intubation Type: IV induction Ventilation: Mask ventilation without difficulty Laryngoscope Size: Mac and 3 Grade View: Grade II Tube type: Oral Tube size: 7.0 mm Number of attempts: 1 Airway Equipment and Method: Stylet and Oral airway Placement Confirmation: ETT inserted through vocal cords under direct vision,  positive ETCO2 and breath sounds checked- equal and bilateral Secured at: 22 cm Tube secured with: Tape Dental Injury: Teeth and Oropharynx as per pre-operative assessment

## 2015-05-22 NOTE — Anesthesia Postprocedure Evaluation (Signed)
  Anesthesia Post-op Note  Patient: Amy Durham  Procedure(s) Performed: Procedure(s): LAPAROSCOPIC CHOLECYSTECTOMY (N/A)  Patient Location: PACU  Anesthesia Type:General  Level of Consciousness: awake, alert , oriented and patient cooperative  Airway and Oxygen Therapy: Patient Spontanous Breathing  Post-op Pain: 2 /10, mild  Post-op Assessment: Post-op Vital signs reviewed, Patient's Cardiovascular Status Stable, Respiratory Function Stable, Patent Airway, No signs of Nausea or vomiting, Pain level controlled and No headache              Post-op Vital Signs: Reviewed and stable  Last Vitals:  Filed Vitals:   05/22/15 0730  BP: 128/84  Pulse:   Temp:   Resp: 19    Complications: No apparent anesthesia complications

## 2015-05-22 NOTE — Transfer of Care (Signed)
Immediate Anesthesia Transfer of Care Note  Patient: Amy Durham  Procedure(s) Performed: Procedure(s): LAPAROSCOPIC CHOLECYSTECTOMY (N/A)  Patient Location: PACU  Anesthesia Type:General  Level of Consciousness: awake and patient cooperative  Airway & Oxygen Therapy: Patient Spontanous Breathing and Patient connected to face mask oxygen  Post-op Assessment: Report given to RN, Post -op Vital signs reviewed and stable and Patient moving all extremities  Post vital signs: Reviewed and stable  Last Vitals:  Filed Vitals:   05/22/15 0730  BP: 128/84  Pulse:   Temp:   Resp: 19    Complications: No apparent anesthesia complications

## 2015-05-22 NOTE — Anesthesia Preprocedure Evaluation (Addendum)
Anesthesia Evaluation  Patient identified by MRN, date of birth, ID band Patient awake    Reviewed: Allergy & Precautions, NPO status , Patient's Chart, lab work & pertinent test results  Airway Mallampati: II  TM Distance: >3 FB     Dental  (+) Teeth Intact   Pulmonary neg pulmonary ROS,  Seasonal allergies  breath sounds clear to auscultation        Cardiovascular negative cardio ROS  Rhythm:Regular Rate:Normal     Neuro/Psych    GI/Hepatic negative GI ROS,   Endo/Other    Renal/GU      Musculoskeletal   Abdominal   Peds  Hematology   Anesthesia Other Findings   Reproductive/Obstetrics                             Anesthesia Physical Anesthesia Plan  ASA: I  Anesthesia Plan: General   Post-op Pain Management:    Induction: Intravenous  Airway Management Planned: Oral ETT  Additional Equipment:   Intra-op Plan:   Post-operative Plan: Extubation in OR  Informed Consent: I have reviewed the patients History and Physical, chart, labs and discussed the procedure including the risks, benefits and alternatives for the proposed anesthesia with the patient or authorized representative who has indicated his/her understanding and acceptance.     Plan Discussed with:   Anesthesia Plan Comments:         Anesthesia Quick Evaluation

## 2015-05-23 ENCOUNTER — Encounter (HOSPITAL_COMMUNITY): Payer: Self-pay | Admitting: General Surgery

## 2015-06-05 ENCOUNTER — Other Ambulatory Visit (HOSPITAL_COMMUNITY): Payer: Self-pay | Admitting: General Surgery

## 2015-06-05 ENCOUNTER — Encounter (HOSPITAL_COMMUNITY): Payer: Self-pay

## 2015-06-05 ENCOUNTER — Ambulatory Visit (HOSPITAL_COMMUNITY)
Admission: RE | Admit: 2015-06-05 | Discharge: 2015-06-05 | Disposition: A | Discharge status: Home / Self Care | Payer: Self-pay | Source: Ambulatory Visit | Attending: General Surgery | Admitting: General Surgery

## 2015-06-05 DIAGNOSIS — K9189 Other postprocedural complications and disorders of digestive system: Secondary | ICD-10-CM

## 2015-06-05 DIAGNOSIS — R932 Abnormal findings on diagnostic imaging of liver and biliary tract: Secondary | ICD-10-CM | POA: Insufficient documentation

## 2015-06-05 DIAGNOSIS — K8041 Calculus of bile duct with cholecystitis, unspecified, with obstruction: Secondary | ICD-10-CM

## 2015-06-05 DIAGNOSIS — K838 Other specified diseases of biliary tract: Secondary | ICD-10-CM

## 2015-06-05 DIAGNOSIS — R109 Unspecified abdominal pain: Secondary | ICD-10-CM | POA: Insufficient documentation

## 2015-06-05 DIAGNOSIS — Z9049 Acquired absence of other specified parts of digestive tract: Secondary | ICD-10-CM | POA: Insufficient documentation

## 2015-06-05 HISTORY — DX: Essential (primary) hypertension: I10

## 2015-06-05 MED ORDER — TECHNETIUM TC 99M MEBROFENIN IV KIT
5.0000 | PACK | Freq: Once | INTRAVENOUS | Status: DC | PRN
  Administered 2015-06-05: 5.2 via INTRAVENOUS
  Filled 2015-06-05: qty 6

## 2015-06-06 ENCOUNTER — Other Ambulatory Visit: Payer: Self-pay

## 2015-06-06 ENCOUNTER — Encounter (HOSPITAL_COMMUNITY)
Admission: RE | Admit: 2015-06-06 | Discharge: 2015-06-06 | Disposition: A | Discharge status: Home / Self Care | Payer: Self-pay | Source: Ambulatory Visit | Attending: Gastroenterology | Admitting: Gastroenterology

## 2015-06-06 ENCOUNTER — Telehealth: Payer: Self-pay | Admitting: Gastroenterology

## 2015-06-06 NOTE — Telephone Encounter (Signed)
Orders entered

## 2015-06-06 NOTE — Telephone Encounter (Signed)
CALLED BY DR. Lovell Sheehan. Pt has retained CBD STONE. NEEDS ERCP/SPHINCTEROTOMY/STONE EXTRACTION TOMORROW. SCHEDULE AND CALL PT WITH APPT INFO.

## 2015-06-07 ENCOUNTER — Ambulatory Visit (HOSPITAL_COMMUNITY)
Admission: RE | Admit: 2015-06-07 | Discharge: 2015-06-07 | Disposition: A | Discharge status: Home / Self Care | Payer: Self-pay | Source: Ambulatory Visit | Attending: Gastroenterology | Admitting: Gastroenterology

## 2015-06-07 ENCOUNTER — Encounter (HOSPITAL_COMMUNITY)
Admission: RE | Disposition: A | Discharge status: Home / Self Care | Payer: Self-pay | Source: Ambulatory Visit | Attending: Gastroenterology

## 2015-06-07 ENCOUNTER — Ambulatory Visit (HOSPITAL_COMMUNITY): Payer: Self-pay

## 2015-06-07 ENCOUNTER — Ambulatory Visit (HOSPITAL_COMMUNITY): Payer: Self-pay | Admitting: Anesthesiology

## 2015-06-07 ENCOUNTER — Encounter (HOSPITAL_COMMUNITY): Payer: Self-pay

## 2015-06-07 DIAGNOSIS — I10 Essential (primary) hypertension: Secondary | ICD-10-CM | POA: Insufficient documentation

## 2015-06-07 DIAGNOSIS — K2950 Unspecified chronic gastritis without bleeding: Secondary | ICD-10-CM | POA: Insufficient documentation

## 2015-06-07 DIAGNOSIS — Z79899 Other long term (current) drug therapy: Secondary | ICD-10-CM | POA: Insufficient documentation

## 2015-06-07 DIAGNOSIS — K8050 Calculus of bile duct without cholangitis or cholecystitis without obstruction: Secondary | ICD-10-CM | POA: Insufficient documentation

## 2015-06-07 HISTORY — PX: ERCP: SHX5425

## 2015-06-07 HISTORY — PX: REMOVAL OF STONES: SHX5545

## 2015-06-07 HISTORY — PX: BIOPSY: SHX5522

## 2015-06-07 HISTORY — PX: SPHINCTEROTOMY: SHX5544

## 2015-06-07 SURGERY — ERCP, WITH INTERVENTION IF INDICATED
Anesthesia: General

## 2015-06-07 MED ORDER — STERILE WATER FOR IRRIGATION IR SOLN
Status: DC | PRN
  Administered 2015-06-07: 1000 mL

## 2015-06-07 MED ORDER — SUCCINYLCHOLINE CHLORIDE 20 MG/ML IJ SOLN
INTRAMUSCULAR | Status: AC
  Filled 2015-06-07: qty 1

## 2015-06-07 MED ORDER — LACTATED RINGERS IV SOLN
INTRAVENOUS | Status: DC
  Administered 2015-06-07 (×2): via INTRAVENOUS

## 2015-06-07 MED ORDER — MIDAZOLAM HCL 2 MG/2ML IJ SOLN
INTRAMUSCULAR | Status: AC
  Filled 2015-06-07: qty 2

## 2015-06-07 MED ORDER — CIPROFLOXACIN IN D5W 400 MG/200ML IV SOLN
INTRAVENOUS | Status: AC
  Filled 2015-06-07: qty 200

## 2015-06-07 MED ORDER — SODIUM CHLORIDE 0.9 % IV SOLN
INTRAVENOUS | Status: DC | PRN
  Administered 2015-06-07: 100 mL

## 2015-06-07 MED ORDER — FENTANYL CITRATE (PF) 250 MCG/5ML IJ SOLN
INTRAMUSCULAR | Status: AC
Start: 2015-06-07 — End: 2015-06-07
  Filled 2015-06-07: qty 5

## 2015-06-07 MED ORDER — CIPROFLOXACIN IN D5W 400 MG/200ML IV SOLN
400.0000 mg | Freq: Once | INTRAVENOUS | Status: AC
  Administered 2015-06-07: 400 mg via INTRAVENOUS

## 2015-06-07 MED ORDER — PROPOFOL 10 MG/ML IV BOLUS
INTRAVENOUS | Status: DC | PRN
  Administered 2015-06-07: 130 mg via INTRAVENOUS
  Administered 2015-06-07: 40 mg via INTRAVENOUS

## 2015-06-07 MED ORDER — METRONIDAZOLE IN NACL 5-0.79 MG/ML-% IV SOLN
500.0000 mg | Freq: Once | INTRAVENOUS | Status: AC
  Administered 2015-06-07: 500 mg via INTRAVENOUS

## 2015-06-07 MED ORDER — ONDANSETRON HCL 4 MG/2ML IJ SOLN
INTRAMUSCULAR | Status: AC
  Filled 2015-06-07: qty 2

## 2015-06-07 MED ORDER — MIDAZOLAM HCL 2 MG/2ML IJ SOLN
1.0000 mg | INTRAMUSCULAR | Status: DC | PRN
  Administered 2015-06-07 (×2): 2 mg via INTRAVENOUS
  Filled 2015-06-07: qty 2

## 2015-06-07 MED ORDER — FENTANYL CITRATE (PF) 100 MCG/2ML IJ SOLN: 25.0000 ug | INTRAMUSCULAR | Status: DC | PRN

## 2015-06-07 MED ORDER — ONDANSETRON HCL 4 MG/2ML IJ SOLN
4.0000 mg | Freq: Once | INTRAMUSCULAR | Status: AC | PRN
  Administered 2015-06-07: 4 mg via INTRAVENOUS
  Filled 2015-06-07: qty 2

## 2015-06-07 MED ORDER — SUCCINYLCHOLINE CHLORIDE 20 MG/ML IJ SOLN
INTRAMUSCULAR | Status: DC | PRN
Start: 2015-06-07 — End: 2015-06-07
  Administered 2015-06-07: 100 mg via INTRAVENOUS

## 2015-06-07 MED ORDER — SODIUM CHLORIDE 0.9 % IV SOLN
INTRAVENOUS | Status: AC
  Filled 2015-06-07: qty 100

## 2015-06-07 MED ORDER — FENTANYL CITRATE (PF) 250 MCG/5ML IJ SOLN
INTRAMUSCULAR | Status: DC | PRN
  Administered 2015-06-07: 50 ug via INTRAVENOUS
  Administered 2015-06-07: 25 ug via INTRAVENOUS
  Administered 2015-06-07: 50 ug via INTRAVENOUS

## 2015-06-07 MED ORDER — DEXAMETHASONE SODIUM PHOSPHATE 4 MG/ML IJ SOLN
INTRAMUSCULAR | Status: AC
  Filled 2015-06-07: qty 1

## 2015-06-07 MED ORDER — ROCURONIUM BROMIDE 100 MG/10ML IV SOLN
INTRAVENOUS | Status: DC | PRN
  Administered 2015-06-07: 5 mg via INTRAVENOUS

## 2015-06-07 MED ORDER — OMEPRAZOLE 20 MG PO CPDR: DELAYED_RELEASE_CAPSULE | ORAL | Status: DC

## 2015-06-07 MED ORDER — LIDOCAINE HCL 1 % IJ SOLN
INTRAMUSCULAR | Status: DC | PRN
  Administered 2015-06-07: 40 mg via INTRADERMAL

## 2015-06-07 MED ORDER — METRONIDAZOLE IN NACL 5-0.79 MG/ML-% IV SOLN
INTRAVENOUS | Status: AC
Start: 2015-06-07 — End: 2015-06-07
  Filled 2015-06-07: qty 100

## 2015-06-07 MED ORDER — DEXAMETHASONE SODIUM PHOSPHATE 4 MG/ML IJ SOLN
4.0000 mg | Freq: Once | INTRAMUSCULAR | Status: AC
  Administered 2015-06-07: 4 mg via INTRAVENOUS

## 2015-06-07 MED ORDER — GLUCAGON HCL RDNA (DIAGNOSTIC) 1 MG IJ SOLR
INTRAMUSCULAR | Status: AC
  Filled 2015-06-07: qty 2

## 2015-06-07 MED ORDER — LIDOCAINE HCL (PF) 1 % IJ SOLN
INTRAMUSCULAR | Status: AC
  Filled 2015-06-07: qty 5

## 2015-06-07 MED ORDER — ROCURONIUM BROMIDE 50 MG/5ML IV SOLN
INTRAVENOUS | Status: AC
  Filled 2015-06-07: qty 1

## 2015-06-07 MED ORDER — PROPOFOL 10 MG/ML IV BOLUS
INTRAVENOUS | Status: AC
Start: 2015-06-07 — End: 2015-06-07
  Filled 2015-06-07: qty 20

## 2015-06-07 MED ORDER — ONDANSETRON HCL 4 MG/2ML IJ SOLN
4.0000 mg | Freq: Once | INTRAMUSCULAR | Status: AC
  Administered 2015-06-07: 4 mg via INTRAVENOUS

## 2015-06-07 SURGICAL SUPPLY — 14 items
BALLN RETRIEVAL 12X15 (BALLOONS) ×3 IMPLANT
BLOCK BITE 60FR ADLT L/F BLUE (MISCELLANEOUS) ×3 IMPLANT
DEVICE INFLATION ENCORE 26 (MISCELLANEOUS) ×3 IMPLANT
DEVICE LOCKING W-BIOPSY CAP (MISCELLANEOUS) ×3 IMPLANT
ELECT REM PT RETURN 9FT ADLT (ELECTROSURGICAL) ×3
ELECTRODE REM PT RTRN 9FT ADLT (ELECTROSURGICAL) ×2 IMPLANT
FORCEP RAD JAW 4 LRG CAP 20 (MISCELLANEOUS) ×3 IMPLANT
KIT ENDO PROCEDURE PEN (KITS) ×3 IMPLANT
KIT ROOM TURNOVER APOR (KITS) ×3 IMPLANT
SPHINCTEROTOME HYDRATOME 44 (MISCELLANEOUS) ×3 IMPLANT
SYR 20CC LL (SYRINGE) ×3 IMPLANT
SYR 50ML LL SCALE MARK (SYRINGE) ×6 IMPLANT
SYSTEM CONTINUOUS INJECTION (MISCELLANEOUS) ×3 IMPLANT
WATER STERILE IRR 1000ML POUR (IV SOLUTION) ×3 IMPLANT

## 2015-06-07 NOTE — Op Note (Signed)
[facility], [place] [address] [place], [address]   ERCP PROCEDURE REPORT  PATIENT: Amy Durham, Amy Durham  MR#: [id number] BIRTHDATE: November 22, 1975  GENDER: female  ENDOSCOPIST: West Bali, MD REFERRED BY: Franky Macho, M.D.  PROCEDURE DATE:  06/07/2015 PROCEDURE: ERCP with biopsy, sphincterotomy, OCCLUSIVE CHOLANGIOGRAM, AND removal of calculus/calculi INDICATIONS:abdominal pain in upper right quadrant.   abnormal liver function test . AFTER CHOLECYSTECTOMY  MEDICATIONS: Per Anesthesia  TOPICAL ANESTHETIC:  DESCRIPTION OF PROCEDURE:     Physical exam was performed.  Informed consent was obtained from the patient after explaining the benefits, risks, and alternatives to the procedure.  The patient was connected to the monitor and placed in the semi-prone position. IV medicine was administered through an indwelling cannula and oxygen via endotracheal tube.  After administration of sedation, the patients esophagus was intubated and the     endoscope was advanced under direct visualization to the second portion of the duodenum.  The bile duct was successfully cannulated using the [facility] Scientific sphincterotome with a 0.25 inch wire.  A occlusive cholangiogram was performed.  The right and left intrahepatic ducts were DILATED.  The cystic duct remnant was visualized and NO bile leak SEEN.  The common duct was approximately 8mm.  THREE filling defects were appreciated.  The sphincterotome was withdrawn and a sphincterotomy was performed using PURE CUT(2000 THEN blended current .  No bleeding was noted. The pancreatic duct was cannulated unintentionally & opacified. Incomplete pancreatogram revealed normal pancreatic duct.  The patient was recovered in [facility] and discharged to HOME in satisfactory condition.  Estimated blood loss is zero unless otherwise noted in this procedure report.  There was a dilation of the common hepatic duct, CBD, and intraheptic ducts.   Three  stones were seen in the distal common bile duct.   MULTIPLE EROSIONS IN THE ANTRUM. COLD FORCEPS BIOPSIES OBTAINED.  EDEMA AND ERYTHEMA IN THE DUODENAL BULB.    The scope was then completely withdrawn from the patient and the procedure terminated.    COMPLICATIONS:    None  ENDOSCOPIC IMPRESSION: MODERATE GASTRITIS AND MILD DUODENITIS THREE CBD STONES, S/P EXTRACTION  RECOMMENDATIONS: FOLLOW A LOW FAT DIET. TAKE OMEPRAZOLE ONCE DAILY WITH BREAKFAST FOR 3 MOS THEN AS NEEDED TO PROTECT YOUR STOMACH AND SMALL BOWEL FROM INJURY FROM IBUPROFEN.  AWAIT BIOPSY RESULTS.    _______________________________ Rosalie DoctorWest Bali, MD 06/07/2015 8:21 PM

## 2015-06-07 NOTE — Discharge Instructions (Signed)
You had 3 stones in your bile duct. I CUT MADE YOUR BILE DUCT OPENING BIGGER AND REMOVED THE STONES. You have gastritis & DUODENITIS DUE TO YOUR USING IBUPROFEN. I biopsied your stomach.   FOLLOW A LOW FAT DIET. SEE INFO BELOW.  TAKE OMEPRAZOLE ONCE DAILY WITH BREAKFAST FOR 3 MOS THEN AS NEEDED TO PROTECT YOUR STOMACH AND SMALL BOWEL FROM INJURY FROM IBUPROFEN.   YOUR BIOPSY RESULTS WILL BE AVAILABLE IN MY CHART AFTER SEP 7 AND MY OFFICE WILL CONTACT YOU IN 10-14 DAYS WITH YOUR RESULTS.     UPPER ENDOSCOPY AFTER CARE Read the instructions outlined below and refer to this sheet in the next week. These discharge instructions provide you with general information on caring for yourself after you leave the hospital. While your treatment has been planned according to the most current medical practices available, unavoidable complications occasionally occur. If you have any problems or questions after discharge, call DR. FIELDS, [phone].  ACTIVITY  You may resume your regular activity, but move at a slower pace for the next 24 hours.   Take frequent rest periods for the next 24 hours.   Walking will help get rid of the air and reduce the bloated feeling in your belly (abdomen).   No driving for 24 hours (because of the medicine (anesthesia) used during the test).   You may shower.   Do not sign any important legal documents or operate any machinery for 24 hours (because of the anesthesia used during the test).    NUTRITION  Drink plenty of fluids.   You may resume your normal diet as instructed by your doctor.   Begin with a light meal and progress to your normal diet. Heavy or fried foods are harder to digest and may make you feel sick to your stomach (nauseated).   Avoid alcoholic beverages for 24 hours or as instructed.    MEDICATIONS  You may resume your normal medications.   WHAT YOU CAN EXPECT TODAY  Some feelings of bloating in the abdomen.   Passage of more gas  than usual.    IF YOU HAD A BIOPSY TAKEN DURING THE UPPER ENDOSCOPY:  Eat a soft diet IF YOU HAVE NAUSEA, BLOATING, ABDOMINAL PAIN, OR VOMITING.    FINDING OUT THE RESULTS OF YOUR TEST Not all test results are available during your visit. DR. Darrick Penna WILL CALL YOU WITHIN 14 DAYS OF YOUR PROCEDUE WITH YOUR RESULTS. Do not assume everything is normal if you have not heard from DR. FIELDS, CALL HER OFFICE AT [phone].  SEEK IMMEDIATE MEDICAL ATTENTION AND CALL THE OFFICE: [phone] IF:  You have more than a spotting of blood in your stool.   Your belly is swollen (abdominal distention).   You are nauseated or vomiting.   You have a temperature over 101F.   You have abdominal pain or discomfort that is severe or gets worse throughout the day.   Gastritis/DUODENITIS  Gastritis is an inflammation (the body's way of reacting to injury and/or infection) of the stomach. DUODENITIS is an inflammation (the body's way of reacting to injury and/or infection) of the FIRST PART OF THE SMALL INTESTINES. It is often caused by bacterial (germ) infections. It can also be caused BY ASPIRIN, BC/GOODY POWDER'S, (IBUPROFEN) MOTRIN, OR ALEVE (NAPROXEN), chemicals (including alcohol), SPICY FOODS, and medications. This illness may be associated with generalized malaise (feeling tired, not well), UPPER ABDOMINAL STOMACH cramps, and fever. One common bacterial cause of gastritis is an organism known as H.  Pylori. This can be treated with antibiotics.      Low-Fat Diet BREADS, CEREALS, PASTA, RICE, DRIED PEAS, AND BEANS These products are high in carbohydrates and most are low in fat. Therefore, they can be increased in the diet as substitutes for fatty foods. They too, however, contain calories and should not be eaten in excess. Cereals can be eaten for snacks as well as for breakfast.  Include foods that contain fiber (fruits, vegetables, whole grains, and legumes). Research shows that fiber may lower  blood cholesterol levels, especially the water-soluble fiber found in fruits, vegetables, oat products, and legumes. FRUITS AND VEGETABLES It is good to eat fruits and vegetables. Besides being sources of fiber, both are rich in vitamins and some minerals. They help you get the daily allowances of these nutrients. Fruits and vegetables can be used for snacks and desserts. MEATS Limit lean meat, chicken, [place], and fish to no more than 6 ounces per day. Beef, Pork, and Lamb Use lean cuts of beef, pork, and lamb. Lean cuts include:  Extra-lean ground beef.  Arm roast.  Sirloin tip.  Center-cut ham.  Round steak.  Loin chops.  Rump roast.  Tenderloin.  Trim all fat off the outside of meats before cooking. It is not necessary to severely decrease the intake of red meat, but lean choices should be made. Lean meat is rich in protein and contains a highly absorbable form of iron. Premenopausal women, in particular, should avoid reducing lean red meat because this could increase the risk for low red blood cells (iron-deficiency anemia).  Chicken and [place] These are good sources of protein. The fat of poultry can be reduced by removing the skin and underlying fat layers before cooking. Chicken and [place] can be substituted for lean red meat in the diet. Poultry should not be fried or covered with high-fat sauces. Fish and Shellfish Fish is a good source of protein. Shellfish contain cholesterol, but they usually are low in saturated fatty acids. The preparation of fish is important. Like chicken and [place], they should not be fried or covered with high-fat sauces. EGGS Egg whites contain no fat or cholesterol. They can be eaten often. Try 1 to 2 egg whites instead of whole eggs in recipes or use egg substitutes that do not contain yolk.  MILK AND DAIRY PRODUCTS Use skim or 1% milk instead of 2% or whole milk. Decrease whole milk, natural, and processed cheeses. Use nonfat or low-fat (2%) cottage  cheese or low-fat cheeses made from vegetable oils. Choose nonfat or low-fat (1 to 2%) yogurt. Experiment with evaporated skim milk in recipes that call for heavy cream. Substitute low-fat yogurt or low-fat cottage cheese for sour cream in dips and salad dressings. Have at least 2 servings of low-fat dairy products, such as 2 glasses of skim (or 1%) milk each day to help get your daily calcium intake.  FATS AND OILS Butterfat, lard, and beef fats are high in saturated fat and cholesterol. These should be avoided.Vegetable fats do not contain cholesterol. AVOID coconut oil, palm oil, and palm kernel oil, WHICH are very high in saturated fats. These should be limited. These fats are often used in bakery goods, processed foods, popcorn, oils, and nondairy creamers. Vegetable shortenings and some peanut butters contain hydrogenated oils, which are also saturated fats. Read the labels on these foods and check for saturated vegetable oils.  Desirable liquid vegetable oils are corn oil, cottonseed oil, olive oil, canola oil, safflower oil, soybean oil, and  sunflower oil. Peanut oil is not as good, but small amounts are acceptable. Buy a heart-healthy tub margarine that has no partially hydrogenated oils in the ingredients. AVOID Mayonnaise and salad dressings often are made from unsaturated fats.  OTHER EATING TIPS Snacks  Most sweets should be limited as snacks. They tend to be rich in calories and fats, and their caloric content outweighs their nutritional value. Some good choices in snacks are graham crackers, melba toast, soda crackers, bagels (no egg), English muffins, fruits, and vegetables. These snacks are preferable to snack crackers, [place] fries, and chips. Popcorn should be air-popped or cooked in small amounts of liquid vegetable oil.  Desserts Eat fruit, low-fat yogurt, and fruit ices instead of pastries, cake, and cookies. Sherbet, angel food cake, gelatin dessert, frozen low-fat yogurt, or other  frozen products that do not contain saturated fat (pure fruit juice bars, frozen ice pops) are also acceptable.   COOKING METHODS Choose those methods that use little or no fat. They include: Poaching.  Braising.  Steaming.  Grilling.  Baking.  Stir-frying.  Broiling.  Microwaving.  Foods can be cooked in a nonstick pan without added fat, or use a nonfat cooking spray in regular cookware. Limit fried foods and avoid frying in saturated fat. Add moisture to lean meats by using water, broth, cooking wines, and other nonfat or low-fat sauces along with the cooking methods mentioned above. Soups and stews should be chilled after cooking. The fat that forms on top after a few hours in the refrigerator should be skimmed off. When preparing meals, avoid using excess salt. Salt can contribute to raising blood pressure in some people.  EATING AWAY FROM HOME Order entres, potatoes, and vegetables without sauces or butter. When meat exceeds the size of a deck of cards (3 to 4 ounces), the rest can be taken home for another meal. Choose vegetable or fruit salads and ask for low-calorie salad dressings to be served on the side. Use dressings sparingly. Limit high-fat toppings, such as bacon, crumbled eggs, cheese, sunflower seeds, and olives. Ask for heart-healthy tub margarine instead of butter.  Endoscopic Retrograde Cholangiopancreatography (ERCP), Care After Refer to this sheet in the next few weeks. These instructions provide you with information on caring for yourself after your procedure. Your health care provider may also give you more specific instructions. Your treatment has been planned according to current medical practices, but problems sometimes occur. Call your health care provider if you have any problems or questions after your procedure.  WHAT TO EXPECT AFTER THE PROCEDURE  After your procedure, it is typical to feel:   Soreness in your throat.   Sick to your stomach (nauseous).    Bloated.  Dizzy.   Fatigued. HOME CARE INSTRUCTIONS  Have a friend or family member stay with you for the first 24 hours after your procedure.  Start taking your usual medicines and eating normally as soon as you feel well enough to do so or as directed by your health care provider. SEEK MEDICAL CARE IF:  You have abdominal pain.   You develop signs of infection, such as:   Chills.   Feeling unwell.  SEEK IMMEDIATE MEDICAL CARE IF:  You have difficulty swallowing.  You have worsening throat, chest, or abdominal pain.  You vomit.  You have bloody or very black stools.  You have a fever. Document Released: 07/12/2013 Document Reviewed: 07/12/2013 [facility] Patient Information 2015 [place], [place]. This information is not intended to replace advice given to you  by your health care provider. Make sure you discuss any questions you have with your health care provider.

## 2015-06-07 NOTE — Anesthesia Procedure Notes (Signed)
Procedure Name: Intubation Date/Time: 06/07/2015 10:16 AM Performed by: Glynn Octave E Pre-anesthesia Checklist: Patient identified, Patient being monitored, Timeout performed, Emergency Drugs available and Suction available Patient Re-evaluated:Patient Re-evaluated prior to inductionOxygen Delivery Method: Circle System Utilized Preoxygenation: Pre-oxygenation with 100% oxygen Intubation Type: IV induction Ventilation: Mask ventilation without difficulty Laryngoscope Size: Mac and 3 Grade View: Grade I Tube type: Oral Tube size: 7.0 mm Number of attempts: 1 Airway Equipment and Method: Stylet Placement Confirmation: ETT inserted through vocal cords under direct vision,  positive ETCO2 and breath sounds checked- equal and bilateral Secured at: 21 cm Tube secured with: Tape Dental Injury: Teeth and Oropharynx as per pre-operative assessment

## 2015-06-07 NOTE — Transfer of Care (Signed)
Immediate Anesthesia Transfer of Care Note  Patient: Amy Durham  Procedure(s) Performed: Procedure(s): ENDOSCOPIC RETROGRADE CHOLANGIOPANCREATOGRAPHY (ERCP) (N/A) SPHINCTEROTOMY (N/A) REMOVAL OF STONES (N/A) BIOPSY (GASTRIC)  Patient Location: PACU  Anesthesia Type:General  Level of Consciousness: awake  Airway & Oxygen Therapy: Patient Spontanous Breathing and Patient connected to face mask oxygen  Post-op Assessment: Report given to RN  Post vital signs: Reviewed and stable  Last Vitals:  Filed Vitals:   06/07/15 1141  BP: 129/75  Pulse: 82  Temp: 36.9 C  Resp: 16    Complications: No apparent anesthesia complications

## 2015-06-07 NOTE — H&P (Addendum)
  Primary Care Physician:  No PCP Per Patient Primary Gastroenterologist:  Dr. Darrick Penna  Pre-Procedure History & Physical: HPI:  Amy Durham is a 39 y.o. female here for obstructive jaundice after cholecystectomy. Nl liver panel before GB REMOVED. NOW WITH ELEVATED liver ENZYMES AND U/S shows DILATED IH/EH AND PROBABLE DISTAL bile duct stone.  Past Medical History  Diagnosis Date  . Seasonal allergies   . Hypertension     Past Surgical History  Procedure Laterality Date  . Cholecystectomy N/A 05/22/2015    Procedure: LAPAROSCOPIC CHOLECYSTECTOMY;  Surgeon: Franky Macho, MD;  Location: [facility];  Service: General;  Laterality: N/A;    Prior to Admission medications   Medication Sig Start Date End Date Taking? Authorizing Provider  desogestrel-ethinyl estradiol (APRI,EMOQUETTE,SOLIA) 0.15-30 MG-MCG tablet Take 1 tablet by mouth daily.     Yes Historical Provider, MD  HYDROcodone-acetaminophen (NORCO/VICODIN) 5-325 MG per tablet Take 1-2 tablets by mouth every 4 (four) hours as needed for moderate pain or severe pain. 05/22/15  Yes Franky Macho, MD  ibuprofen (ADVIL,MOTRIN) 200 MG tablet Take 600 mg by mouth every 6 (six) hours as needed for headache.   Yes Historical Provider, MD  loratadine (CLARITIN) 10 MG tablet Take 10 mg by mouth daily.   Yes Historical Provider, MD  ondansetron (ZOFRAN) 4 MG tablet Take 4 mg by mouth every 8 (eight) hours as needed for nausea or vomiting.   Yes Historical Provider, MD    Allergies as of 06/06/2015 - Review Complete 06/06/2015  Allergen Reaction Noted  . Penicillins  09/16/2011    History reviewed. No pertinent family history.  Social History   Social History  . Marital Status: Married    Spouse Name: N/A  . Number of Children: N/A  . Years of Education: N/A   Occupational History  . Not on file.   Social History Main Topics  . Smoking status: Never Smoker   . Smokeless tobacco: Not on file  . Alcohol Use: No  . Drug Use: No  . Sexual  Activity: Yes    Birth Control/ Protection: Pill   Other Topics Concern  . Not on file   Social History Narrative    Review of Systems: See HPI, otherwise negative ROS   Physical Exam: BP 117/71 mmHg  Pulse 81  Temp(Src) 98 F (36.7 C) (Oral)  Resp 18  Ht  (1.727 m)  Wt 215 lb (97.523 kg)  BMI 32.70 kg/m2  SpO2 97%  LMP 05/22/2015 General:   Alert,  pleasant and cooperative in NAD Head:  Normocephalic and atraumatic. Neck:  Supple; Lungs:  Clear throughout to auscultation.    Heart:  Regular rate and rhythm. Abdomen:  Soft, nontender and nondistended. Normal bowel sounds, without guarding, and without rebound.   Neurologic:  Alert and  oriented x4;  grossly normal neurologically.  Impression/Plan:    IMPACTED CBD STONE  PLAN:  1. ERCP/SPHINCTEROTOMY/STONE EXTRACTION TODAY.

## 2015-06-07 NOTE — Anesthesia Preprocedure Evaluation (Signed)
Anesthesia Evaluation  Patient identified by MRN, date of birth, ID band Patient awake    Reviewed: Allergy & Precautions, NPO status , Patient's Chart, lab work & pertinent test results  Airway Mallampati: II  TM Distance: >3 FB     Dental  (+) Teeth Intact   Pulmonary neg pulmonary ROS,  Seasonal allergies  breath sounds clear to auscultation        Cardiovascular hypertension, negative cardio ROS  Rhythm:Regular Rate:Normal     Neuro/Psych    GI/Hepatic negative GI ROS,   Endo/Other    Renal/GU      Musculoskeletal   Abdominal   Peds  Hematology   Anesthesia Other Findings   Reproductive/Obstetrics                             Anesthesia Physical Anesthesia Plan  ASA: I  Anesthesia Plan: General   Post-op Pain Management:    Induction: Intravenous  Airway Management Planned: Oral ETT  Additional Equipment:   Intra-op Plan:   Post-operative Plan: Extubation in OR  Informed Consent: I have reviewed the patients History and Physical, chart, labs and discussed the procedure including the risks, benefits and alternatives for the proposed anesthesia with the patient or authorized representative who has indicated his/her understanding and acceptance.     Plan Discussed with:   Anesthesia Plan Comments:         Anesthesia Quick Evaluation

## 2015-06-07 NOTE — Anesthesia Postprocedure Evaluation (Signed)
Anesthesia Post Note  Patient: Amy Durham  Procedure(s) Performed: Procedure(s) (LRB): ENDOSCOPIC RETROGRADE CHOLANGIOPANCREATOGRAPHY (ERCP) (N/A) SPHINCTEROTOMY (N/A) REMOVAL OF COMMON BILE DUCT STONES (3) (N/A) BIOPSY (GASTRIC)  Anesthesia type: General  Patient location: PACU  Post pain: Pain level controlled  Post assessment: Post-op Vital signs reviewed, Patient's Cardiovascular Status Stable, Respiratory Function Stable, Patent Airway, No signs of Nausea or vomiting and Pain level controlled  Last Vitals:  Filed Vitals:   06/07/15 1301  BP: 137/87  Pulse: 75  Temp: 36.6 C  Resp: 16    Post vital signs: Reviewed and stable  Level of consciousness: awake and alert   Complications: No apparent anesthesia complications

## 2015-06-11 ENCOUNTER — Encounter (HOSPITAL_COMMUNITY): Payer: Self-pay | Admitting: Gastroenterology

## 2015-06-11 ENCOUNTER — Telehealth: Payer: Self-pay

## 2015-06-11 NOTE — Telephone Encounter (Signed)
Is she having any pain? Urine color changes likely secondary to dehydration. She had an ERCP. Drink at least 8 glasses of water a day to keep urine light yellow. If no pain, continue to monitor.

## 2015-06-11 NOTE — Telephone Encounter (Signed)
Pt called and said Dr. Darrick Penna did an MRCP on her on Friday. She said that her urine had been dark but got lighter again on Friday after the procedure. Then it got dark again and now it is lighter again.  She just wants to know if that should be a concern to her. She is supposed to go back tomorrow and asked if we needed to give her a note or Dr.Jenkins. ( He did her gallbladder surgery about 3 weeks ago) I told her that Dr. Lovell Sheehan would be the one to give her a note to go back to work Dr. Darrick Penna is off today and I will send a note to Gerrit Halls , NP in Dr. Darrick Penna absence in reference to her urine.

## 2015-06-11 NOTE — Telephone Encounter (Signed)
PT is aware to drink lots of water. She is not having any pain.  She will monitor and let us know if any problems.

## 2015-06-18 NOTE — Telephone Encounter (Signed)
Please call pt. HER stomach Bx shows gastritis. SHE HAS gastritis & DUODENITIS DUE TO USING IBUPROFEN.   FOLLOW A LOW FAT DIET.   TAKE OMEPRAZOLE ONCE DAILY WITH BREAKFAST FOR 3 MOS THEN AS NEEDED TO PROTECT YOUR STOMACH AND SMALL BOWEL FROM INJURY FROM IBUPROFEN.

## 2015-06-18 NOTE — Telephone Encounter (Signed)
PT is aware of results.  

## 2016-11-04 DIAGNOSIS — Z01419 Encounter for gynecological examination (general) (routine) without abnormal findings: Secondary | ICD-10-CM | POA: Diagnosis not present

## 2016-11-04 DIAGNOSIS — Z1151 Encounter for screening for human papillomavirus (HPV): Secondary | ICD-10-CM | POA: Diagnosis not present

## 2016-11-04 DIAGNOSIS — Z3040 Encounter for surveillance of contraceptives, unspecified: Secondary | ICD-10-CM | POA: Diagnosis not present

## 2016-11-04 DIAGNOSIS — Z1239 Encounter for other screening for malignant neoplasm of breast: Secondary | ICD-10-CM | POA: Diagnosis not present

## 2016-11-10 DIAGNOSIS — Z30430 Encounter for insertion of intrauterine contraceptive device: Secondary | ICD-10-CM | POA: Diagnosis not present

## 2016-11-17 DIAGNOSIS — I10 Essential (primary) hypertension: Secondary | ICD-10-CM | POA: Diagnosis not present

## 2016-11-17 DIAGNOSIS — Z1231 Encounter for screening mammogram for malignant neoplasm of breast: Secondary | ICD-10-CM | POA: Diagnosis not present

## 2016-11-17 DIAGNOSIS — Z6833 Body mass index (BMI) 33.0-33.9, adult: Secondary | ICD-10-CM | POA: Diagnosis not present

## 2016-11-17 DIAGNOSIS — K219 Gastro-esophageal reflux disease without esophagitis: Secondary | ICD-10-CM | POA: Diagnosis not present

## 2016-11-17 DIAGNOSIS — Z30430 Encounter for insertion of intrauterine contraceptive device: Secondary | ICD-10-CM | POA: Diagnosis not present

## 2016-12-01 DIAGNOSIS — I10 Essential (primary) hypertension: Secondary | ICD-10-CM | POA: Diagnosis not present

## 2016-12-19 DIAGNOSIS — E782 Mixed hyperlipidemia: Secondary | ICD-10-CM | POA: Diagnosis not present

## 2016-12-19 DIAGNOSIS — F419 Anxiety disorder, unspecified: Secondary | ICD-10-CM | POA: Diagnosis not present

## 2016-12-19 DIAGNOSIS — Z0000 Encounter for general adult medical examination without abnormal findings: Secondary | ICD-10-CM | POA: Diagnosis not present

## 2016-12-19 DIAGNOSIS — Z6833 Body mass index (BMI) 33.0-33.9, adult: Secondary | ICD-10-CM | POA: Diagnosis not present

## 2016-12-28 DIAGNOSIS — Z30431 Encounter for routine checking of intrauterine contraceptive device: Secondary | ICD-10-CM | POA: Diagnosis not present

## 2017-01-22 DIAGNOSIS — H40033 Anatomical narrow angle, bilateral: Secondary | ICD-10-CM | POA: Diagnosis not present

## 2017-01-22 DIAGNOSIS — H04123 Dry eye syndrome of bilateral lacrimal glands: Secondary | ICD-10-CM | POA: Diagnosis not present

## 2017-01-30 DIAGNOSIS — M79673 Pain in unspecified foot: Secondary | ICD-10-CM | POA: Diagnosis not present

## 2017-01-30 DIAGNOSIS — B009 Herpesviral infection, unspecified: Secondary | ICD-10-CM | POA: Diagnosis not present

## 2017-01-30 DIAGNOSIS — F419 Anxiety disorder, unspecified: Secondary | ICD-10-CM | POA: Diagnosis not present

## 2017-03-01 DIAGNOSIS — J309 Allergic rhinitis, unspecified: Secondary | ICD-10-CM | POA: Diagnosis not present

## 2017-03-01 DIAGNOSIS — J019 Acute sinusitis, unspecified: Secondary | ICD-10-CM | POA: Diagnosis not present

## 2017-03-24 DIAGNOSIS — M722 Plantar fascial fibromatosis: Secondary | ICD-10-CM | POA: Diagnosis not present

## 2017-03-24 DIAGNOSIS — M79672 Pain in left foot: Secondary | ICD-10-CM | POA: Diagnosis not present

## 2017-05-05 DIAGNOSIS — M722 Plantar fascial fibromatosis: Secondary | ICD-10-CM | POA: Diagnosis not present

## 2017-05-05 DIAGNOSIS — M79672 Pain in left foot: Secondary | ICD-10-CM | POA: Diagnosis not present

## 2017-05-27 DIAGNOSIS — M79672 Pain in left foot: Secondary | ICD-10-CM | POA: Diagnosis not present

## 2017-05-27 DIAGNOSIS — M722 Plantar fascial fibromatosis: Secondary | ICD-10-CM | POA: Diagnosis not present

## 2017-10-30 DIAGNOSIS — F411 Generalized anxiety disorder: Secondary | ICD-10-CM | POA: Diagnosis not present

## 2018-01-21 DIAGNOSIS — H04123 Dry eye syndrome of bilateral lacrimal glands: Secondary | ICD-10-CM | POA: Diagnosis not present

## 2018-01-21 DIAGNOSIS — H40033 Anatomical narrow angle, bilateral: Secondary | ICD-10-CM | POA: Diagnosis not present

## 2018-02-14 DIAGNOSIS — Z1231 Encounter for screening mammogram for malignant neoplasm of breast: Secondary | ICD-10-CM | POA: Diagnosis not present

## 2018-03-05 DIAGNOSIS — L237 Allergic contact dermatitis due to plants, except food: Secondary | ICD-10-CM | POA: Diagnosis not present

## 2018-03-05 DIAGNOSIS — Z6832 Body mass index (BMI) 32.0-32.9, adult: Secondary | ICD-10-CM | POA: Diagnosis not present

## 2018-04-16 DIAGNOSIS — I10 Essential (primary) hypertension: Secondary | ICD-10-CM | POA: Diagnosis not present

## 2018-04-16 DIAGNOSIS — E782 Mixed hyperlipidemia: Secondary | ICD-10-CM | POA: Diagnosis not present

## 2018-04-21 DIAGNOSIS — Z6830 Body mass index (BMI) 30.0-30.9, adult: Secondary | ICD-10-CM | POA: Diagnosis not present

## 2018-04-21 DIAGNOSIS — E782 Mixed hyperlipidemia: Secondary | ICD-10-CM | POA: Diagnosis not present

## 2018-04-21 DIAGNOSIS — I10 Essential (primary) hypertension: Secondary | ICD-10-CM | POA: Diagnosis not present

## 2018-09-20 DIAGNOSIS — J029 Acute pharyngitis, unspecified: Secondary | ICD-10-CM | POA: Diagnosis not present

## 2018-09-20 DIAGNOSIS — J019 Acute sinusitis, unspecified: Secondary | ICD-10-CM | POA: Diagnosis not present

## 2019-01-06 DIAGNOSIS — R509 Fever, unspecified: Secondary | ICD-10-CM | POA: Diagnosis not present

## 2019-01-06 DIAGNOSIS — J069 Acute upper respiratory infection, unspecified: Secondary | ICD-10-CM | POA: Diagnosis not present

## 2019-01-06 DIAGNOSIS — R05 Cough: Secondary | ICD-10-CM | POA: Diagnosis not present

## 2019-01-06 DIAGNOSIS — R0982 Postnasal drip: Secondary | ICD-10-CM | POA: Diagnosis not present

## 2019-05-25 DIAGNOSIS — F419 Anxiety disorder, unspecified: Secondary | ICD-10-CM | POA: Diagnosis not present

## 2019-05-25 DIAGNOSIS — J019 Acute sinusitis, unspecified: Secondary | ICD-10-CM | POA: Diagnosis not present

## 2019-05-26 ENCOUNTER — Other Ambulatory Visit: Payer: Self-pay

## 2019-05-26 DIAGNOSIS — Z20822 Contact with and (suspected) exposure to covid-19: Secondary | ICD-10-CM

## 2019-05-27 LAB — NOVEL CORONAVIRUS, NAA: SARS-CoV-2, NAA: NOT DETECTED

## 2019-05-29 ENCOUNTER — Other Ambulatory Visit: Payer: Self-pay

## 2019-05-29 DIAGNOSIS — Z20822 Contact with and (suspected) exposure to covid-19: Secondary | ICD-10-CM

## 2019-05-30 LAB — NOVEL CORONAVIRUS, NAA: SARS-CoV-2, NAA: NOT DETECTED

## 2019-09-26 ENCOUNTER — Other Ambulatory Visit: Payer: Self-pay

## 2019-09-26 ENCOUNTER — Ambulatory Visit: Payer: MEDICAID | Attending: Internal Medicine

## 2019-09-26 DIAGNOSIS — Z20822 Contact with and (suspected) exposure to covid-19: Secondary | ICD-10-CM

## 2019-09-26 DIAGNOSIS — Z20828 Contact with and (suspected) exposure to other viral communicable diseases: Secondary | ICD-10-CM | POA: Diagnosis not present

## 2019-09-27 ENCOUNTER — Ambulatory Visit: Payer: BC Managed Care – PPO | Admitting: Podiatry

## 2019-09-27 ENCOUNTER — Ambulatory Visit (INDEPENDENT_AMBULATORY_CARE_PROVIDER_SITE_OTHER): Payer: BC Managed Care – PPO

## 2019-09-27 ENCOUNTER — Other Ambulatory Visit: Payer: Self-pay

## 2019-09-27 VITALS — BP 134/83 | HR 66 | Temp 98.6°F

## 2019-09-27 DIAGNOSIS — M722 Plantar fascial fibromatosis: Secondary | ICD-10-CM | POA: Diagnosis not present

## 2019-09-27 MED ORDER — MELOXICAM 15 MG PO TABS: 15.0000 mg | ORAL_TABLET | Freq: Every day | ORAL | 1 refills | Status: DC

## 2019-09-27 MED ORDER — METHYLPREDNISOLONE 4 MG PO TBPK: ORAL_TABLET | ORAL | 0 refills | Status: DC

## 2019-09-28 LAB — NOVEL CORONAVIRUS, NAA: SARS-CoV-2, NAA: NOT DETECTED

## 2019-10-04 NOTE — Progress Notes (Signed)
   Subjective: 43 y.o. female presenting today as a new patient with a chief complaint of bilateral plantar heel pain that has been ongoing for the past two years. She was last seen by a podiatrist in [place], [place], Dr. Blanch Media, and was diagnosed with plantar fasciitis in which she has received injections which have helped alleviate the symptoms. Being on her feet for long periods of time increases the pain. She has been using ice therapy and wearing Crocs for treatment. Patient is here for further evaluation and treatment.    Past Medical History:  Diagnosis Date  . Hypertension   . Seasonal allergies      Objective: Physical Exam General: The patient is alert and oriented x3 in no acute distress.  Dermatology: Skin is warm, dry and supple bilateral lower extremities. Negative for open lesions or macerations bilateral.   Vascular: Dorsalis Pedis and Posterior Tibial pulses palpable bilateral.  Capillary fill time is immediate to all digits.  Neurological: Epicritic and protective threshold intact bilateral.   Musculoskeletal: Tenderness to palpation to the plantar aspect of the bilateral heels along the plantar fascia. All other joints range of motion within normal limits bilateral. Strength 5/5 in all groups bilateral.   Radiographic exam: Normal osseous mineralization. Joint spaces preserved. No fracture/dislocation/boney destruction. No other soft tissue abnormalities or radiopaque foreign bodies.   Assessment: 1. plantar fasciitis bilateral feet  Plan of Care:  1. Patient evaluated. Xrays reviewed.   2. Injection of 0.5cc Celestone soluspan injected into the bilateral heels.  3. Rx for Medrol Dose Pak placed 4. Rx for Meloxicam ordered for patient. 5. Plantar fascial band(s) dispensed for bilateral plantar fasciitis. 6. Instructed patient regarding therapies and modalities at home to alleviate symptoms.  7. Continue using OTC Dr. Felicie Morn arch supports.  8. Return to clinic in 4  weeks.    Currently unemployed.    Edrick Kins, DPM [facility]  Dr. Edrick Kins, DPM    [address], [place] [address]                Office [phone]  Fax [phone]

## 2019-10-25 ENCOUNTER — Other Ambulatory Visit: Payer: Self-pay

## 2019-10-25 ENCOUNTER — Ambulatory Visit (INDEPENDENT_AMBULATORY_CARE_PROVIDER_SITE_OTHER): Payer: 59 | Admitting: Podiatry

## 2019-10-25 DIAGNOSIS — M722 Plantar fascial fibromatosis: Secondary | ICD-10-CM

## 2019-10-29 NOTE — Progress Notes (Signed)
   Subjective: 44 y.o. female presenting today for follow up evaluation of bilateral plantar fasciitis. She states she is doing well and improving. She reports taking the Meloxicam as directed, which helped alleviate the pain, but caused stomach upset. She also reports the injections helped improve her symptoms. She states the fascial braces were too small and one of them broke. She denies any current worsening factors. Patient is here for further evaluation and treatment.    Past Medical History:  Diagnosis Date  . Hypertension   . Seasonal allergies      Objective: Physical Exam General: The patient is alert and oriented x3 in no acute distress.  Dermatology: Skin is warm, dry and supple bilateral lower extremities. Negative for open lesions or macerations bilateral.   Vascular: Dorsalis Pedis and Posterior Tibial pulses palpable bilateral.  Capillary fill time is immediate to all digits.  Neurological: Epicritic and protective threshold intact bilateral.   Musculoskeletal: Tenderness to palpation to the plantar aspect of the bilateral heels along the plantar fascia. All other joints range of motion within normal limits bilateral. Strength 5/5 in all groups bilateral.   Assessment: 1. plantar fasciitis bilateral feet  Plan of Care:  1. Patient evaluated.   2. Injection of 0.5cc Celestone soluspan injected into the bilateral heels.  3. Rx for Meloxicam ordered for patient. 4. Plantar fascial band(s) dispensed for bilateral plantar fasciitis. 5. Instructed patient regarding therapies and modalities at home to alleviate symptoms.  6. Return to clinic as needed.    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   Felecia Shelling, DPM [facility]  Dr. Felecia Shelling, DPM    [address] [place], [place] [address]                Office [phone]  Fax [phone]

## 2019-12-01 ENCOUNTER — Other Ambulatory Visit: Payer: Self-pay

## 2019-12-01 MED ORDER — MELOXICAM 15 MG PO TABS: 15.0000 mg | ORAL_TABLET | Freq: Every day | ORAL | 1 refills | Status: DC

## 2019-12-01 NOTE — Progress Notes (Signed)
Called pt- informed of Rx Meloxicam 15mg  sent to [facility] with 1 refill

## 2020-01-06 ENCOUNTER — Encounter (HOSPITAL_COMMUNITY): Payer: Self-pay | Admitting: *Deleted

## 2020-01-06 ENCOUNTER — Emergency Department (HOSPITAL_COMMUNITY)
Admission: EM | Admit: 2020-01-06 | Discharge: 2020-01-06 | Disposition: A | Discharge status: Home / Self Care | Payer: 59 | Attending: Emergency Medicine | Admitting: Emergency Medicine

## 2020-01-06 ENCOUNTER — Emergency Department (HOSPITAL_COMMUNITY): Payer: 59

## 2020-01-06 ENCOUNTER — Other Ambulatory Visit: Payer: Self-pay

## 2020-01-06 DIAGNOSIS — R0789 Other chest pain: Secondary | ICD-10-CM | POA: Insufficient documentation

## 2020-01-06 DIAGNOSIS — I10 Essential (primary) hypertension: Secondary | ICD-10-CM | POA: Insufficient documentation

## 2020-01-06 DIAGNOSIS — Z79899 Other long term (current) drug therapy: Secondary | ICD-10-CM | POA: Diagnosis not present

## 2020-01-06 DIAGNOSIS — R079 Chest pain, unspecified: Secondary | ICD-10-CM | POA: Diagnosis present

## 2020-01-06 LAB — CBC
HCT: 44.9 % (ref 36.0–46.0)
Hemoglobin: 14.9 g/dL (ref 12.0–15.0)
MCH: 32.3 pg (ref 26.0–34.0)
MCHC: 33.2 g/dL (ref 30.0–36.0)
MCV: 97.4 fL (ref 80.0–100.0)
Platelets: 325 10*3/uL (ref 150–400)
RBC: 4.61 MIL/uL (ref 3.87–5.11)
RDW: 12.1 % (ref 11.5–15.5)
WBC: 9 10*3/uL (ref 4.0–10.5)
nRBC: 0 % (ref 0.0–0.2)

## 2020-01-06 LAB — BASIC METABOLIC PANEL
Anion gap: 8 (ref 5–15)
BUN: 10 mg/dL (ref 6–20)
CO2: 28 mmol/L (ref 22–32)
Calcium: 10 mg/dL (ref 8.9–10.3)
Chloride: 100 mmol/L (ref 98–111)
Creatinine, Ser: 0.78 mg/dL (ref 0.44–1.00)
GFR calc Af Amer: >60 mL/min (ref >60)
GFR calc non Af Amer: >60 mL/min (ref >60)
Glucose, Bld: 104 mg/dL — ABNORMAL HIGH (ref 70–99)
Potassium: 3.7 mmol/L (ref 3.5–5.1)
Sodium: 136 mmol/L (ref 135–145)

## 2020-01-06 LAB — TROPONIN I (HIGH SENSITIVITY)
Troponin I (High Sensitivity): <2 ng/L (ref <18)
Troponin I (High Sensitivity): <2 ng/L (ref <18)

## 2020-01-06 LAB — POC URINE PREG, ED: Preg Test, Ur: NEGATIVE

## 2020-01-06 MED ORDER — ASPIRIN 81 MG PO CHEW
324.0000 mg | CHEWABLE_TABLET | Freq: Once | ORAL | Status: AC
  Administered 2020-01-06: 15:00:00 324 mg via ORAL
  Filled 2020-01-06: qty 4

## 2020-01-06 MED ORDER — DICLOFENAC SODIUM 75 MG PO TBEC: 75.0000 mg | DELAYED_RELEASE_TABLET | Freq: Two times a day (BID) | ORAL | 0 refills | Status: DC

## 2020-01-06 MED ORDER — LIDOCAINE 4 % EX CREA: 1.0000 "application " | TOPICAL_CREAM | Freq: Three times a day (TID) | CUTANEOUS | 0 refills | Status: DC | PRN

## 2020-01-06 MED ORDER — MORPHINE SULFATE (PF) 4 MG/ML IV SOLN
4.0000 mg | Freq: Once | INTRAVENOUS | Status: AC
  Administered 2020-01-06: 4 mg via INTRAVENOUS
  Filled 2020-01-06: qty 1

## 2020-01-06 MED ORDER — NITROGLYCERIN 0.4 MG SL SUBL
0.4000 mg | SUBLINGUAL_TABLET | SUBLINGUAL | Status: DC | PRN
  Administered 2020-01-06: 0.4 mg via SUBLINGUAL
  Filled 2020-01-06: qty 1

## 2020-01-06 MED ORDER — CYCLOBENZAPRINE HCL 10 MG PO TABS: 10.0000 mg | ORAL_TABLET | Freq: Two times a day (BID) | ORAL | 0 refills | Status: DC | PRN

## 2020-01-06 MED ORDER — KETOROLAC TROMETHAMINE 30 MG/ML IJ SOLN
30.0000 mg | Freq: Once | INTRAMUSCULAR | Status: AC
  Administered 2020-01-06: 18:00:00 30 mg via INTRAVENOUS
  Filled 2020-01-06: qty 1

## 2020-01-06 NOTE — ED Notes (Signed)
Pt reports no change with NTG SL  She is resting watching TV

## 2020-01-06 NOTE — ED Provider Notes (Signed)
[facility] EMERGENCY DEPARTMENT Provider Note   CSN: [id number] Arrival date & time: 01/06/20  1406     History Chief Complaint  Patient presents with  . Chest Pain    Amy Durham is a 44 y.o. female with history of hypertension, seasonal allergies presents for evaluation of acute onset, constant left-sided chest pains beginning at 10 AM today while sitting in her recliner.  She reports the pain feels like a tightness and soreness, radiates to the left side of the back.  She notes associated nausea and feels as though she cannot take a deep breath because her pain worsens when she takes a deep breath.  She denies diaphoresis, lightheadedness, syncope, vomiting, or abdominal pain.  The pain worsens with any movements.  She took 2 tablets of Tums without relief.  She is a non-smoker, denies recreational drug use.  Reports that her father had his first MI in his 21s.  She also notes a history of hyperlipidemia for which she is currently not taking any medications but is attempting to control with lifestyle and dietary modifications.  She denies any recent travel or surgeries, no hemoptysis, no prior history of DVT or PE, no leg swelling.  She has a Mirena IUD, no oral hormonal contraceptives or replacement therapy.  The history is provided by the patient.       Past Medical History:  Diagnosis Date  . Hypertension   . Seasonal allergies     Patient Active Problem List   Diagnosis Date Noted  . Common bile duct stone     Past Surgical History:  Procedure Laterality Date  . BIOPSY  06/07/2015   Procedure: BIOPSY (GASTRIC);  Surgeon: West Bali, MD;  Location: [facility];  Service: Endoscopy;;  . CHOLECYSTECTOMY N/A 05/22/2015   Procedure: LAPAROSCOPIC CHOLECYSTECTOMY;  Surgeon: Franky Macho, MD;  Location: [facility];  Service: General;  Laterality: N/A;  . ERCP N/A 06/07/2015   Procedure: ENDOSCOPIC RETROGRADE CHOLANGIOPANCREATOGRAPHY (ERCP);  Surgeon: West Bali, MD;  Location: [facility];   Service: Endoscopy;  Laterality: N/A;  . REMOVAL OF STONES N/A 06/07/2015   Procedure: REMOVAL OF COMMON BILE DUCT STONES (3);  Surgeon: West Bali, MD;  Location: [facility];  Service: Endoscopy;  Laterality: N/A;  . SPHINCTEROTOMY N/A 06/07/2015   Procedure: SPHINCTEROTOMY;  Surgeon: West Bali, MD;  Location: [facility];  Service: Endoscopy;  Laterality: N/A;     OB History   No obstetric history on file.     No family history on file.  Social History   Tobacco Use  . Smoking status: Never Smoker  . Smokeless tobacco: Never Used  Substance Use Topics  . Alcohol use: No  . Drug use: No    Home Medications Prior to Admission medications   Medication Sig Start Date End Date Taking? Authorizing Provider  escitalopram (LEXAPRO) 10 MG tablet Take 10 mg by mouth daily. 08/18/19  Yes [provider]  ibuprofen (ADVIL,MOTRIN) 200 MG tablet Take 600 mg by mouth every 6 (six) hours as needed for headache.   Yes [provider]  lisinopril (ZESTRIL) 10 MG tablet Take 10 mg by mouth daily. 07/03/19  Yes [provider]  loratadine (CLARITIN) 10 MG tablet Take 10 mg by mouth daily.   Yes [provider]  nitrofurantoin, macrocrystal-monohydrate, (MACROBID) 100 MG capsule Take 100 mg by mouth 2 (two) times daily. 01/03/20  Yes [provider]  cyclobenzaprine (FLEXERIL) 10 MG tablet Take 1 tablet (10 mg  total) by mouth 2 (two) times daily as needed for muscle spasms. 01/06/20   Rodell Perna A, PA-C  desogestrel-ethinyl estradiol (APRI,EMOQUETTE,SOLIA) 0.15-30 MG-MCG tablet Take 1 tablet by mouth daily.      [provider]  diclofenac (VOLTAREN) 75 MG EC tablet Take 1 tablet (75 mg total) by mouth 2 (two) times daily. 01/06/20   Beonca Gibb A, PA-C  lidocaine (LMX) 4 % cream Apply 1 application topically 3 (three) times daily as needed. 01/06/20   Agatha Duplechain A, PA-C    Allergies    Penicillins  Review of Systems   Review of Systems    Constitutional: Negative for chills and fever.  Respiratory: Positive for chest tightness. Negative for cough, shortness of breath and wheezing.   Cardiovascular: Positive for chest pain. Negative for leg swelling.  Gastrointestinal: Positive for nausea. Negative for abdominal pain, diarrhea and vomiting.  Neurological: Negative for syncope and light-headedness.  All other systems reviewed and are negative.   Physical Exam Updated Vital Signs BP 117/62   Pulse 72   Temp 98.6 F (37 C) (Oral)   Resp 18   Ht 5\' 8"  (1.727 m)   Wt 99.8 kg   SpO2 98%   BMI 33.45 kg/m   Physical Exam Vitals and nursing note reviewed.  Constitutional:      General: She is not in acute distress.    Appearance: She is well-developed.  HENT:     Head: Normocephalic and atraumatic.  Eyes:     General:        Right eye: No discharge.        Left eye: No discharge.     Conjunctiva/sclera: Conjunctivae normal.  Neck:     Vascular: No JVD.     Trachea: No tracheal deviation.  Cardiovascular:     Rate and Rhythm: Normal rate and regular rhythm.     Pulses:          Radial pulses are 2+ on the right side and 2+ on the left side.       Dorsalis pedis pulses are 2+ on the right side and 2+ on the left side.       Posterior tibial pulses are 2+ on the right side and 2+ on the left side.     Heart sounds: Normal heart sounds.     Comments:  Homans sign absent bilaterally, no lower extremity edema, no palpable cords, compartments are soft  Pulmonary:     Effort: Pulmonary effort is normal.     Breath sounds: Normal breath sounds.  Chest:     Chest wall: Tenderness present.     Comments: Diffuse tenderness to palpation of the  left anterior and posterior chest wall with no deformity, crepitus, ecchymosis or flail segment.  Abdominal:     General: There is no distension.  Musculoskeletal:     Cervical back: Normal range of motion and neck supple.     Right lower leg: No tenderness. No edema.      Left lower leg: No tenderness. No edema.  Skin:    General: Skin is warm and dry.     Findings: No erythema.  Neurological:     Mental Status: She is alert.  Psychiatric:        Behavior: Behavior normal.     ED Results / Procedures / Treatments   Labs (all labs ordered are listed, but only abnormal results are displayed) Labs Reviewed  BASIC METABOLIC PANEL - Abnormal; Notable for the  following components:      Result Value   Glucose, Bld 104 (*)    All other components within normal limits  CBC  POC URINE PREG, ED  TROPONIN I (HIGH SENSITIVITY)  TROPONIN I (HIGH SENSITIVITY)    EKG None  Radiology DG Chest 2 View  Result Date: 01/06/2020 CLINICAL DATA:  44 year old female with chest pain EXAM: CHEST - 2 VIEW COMPARISON:  Prior chest x-ray 04/23/2015 FINDINGS: The lungs are clear and negative for focal airspace consolidation, pulmonary edema or suspicious pulmonary nodule. No pleural effusion or pneumothorax. Cardiac and mediastinal contours are within normal limits. No acute fracture or lytic or blastic osseous lesions. The visualized upper abdominal bowel gas pattern is unremarkable. Surgical clips in the right upper quadrant suggest prior cholecystectomy. IMPRESSION: No active cardiopulmonary disease. Electronically Signed   By: Malachy Moan M.D.   On: 01/06/2020 15:20    Procedures Procedures (including critical care time)  Medications Ordered in ED Medications  nitroGLYCERIN (NITROSTAT) SL tablet 0.4 mg (0.4 mg Sublingual Given 01/06/20 1546)  aspirin chewable tablet 324 mg (324 mg Oral Given 01/06/20 1503)  ketorolac (TORADOL) 30 MG/ML injection 30 mg (30 mg Intravenous Given 01/06/20 1746)  morphine 4 MG/ML injection 4 mg (4 mg Intravenous Given 01/06/20 1822)    ED Course  I have reviewed the triage vital signs and the nursing notes.  Pertinent labs & imaging results that were available during my care of the patient were reviewed by me and considered in my medical  decision making (see chart for details).    MDM Rules/Calculators/A&P                      Patient presenting for evaluation of left-sided chest pains.  She is afebrile, vital signs are stable.  She is nontoxic in appearance.  The pain is very much so reproducible on palpation of the anterior and posterior left chest wall suggesting likely musculoskeletal etiology.  However she does have some risk factors for MI so we will obtain blood work, chest x-ray, EKG and reassess.  Lab work reviewed and interpreted by myself shows no leukocytosis, no anemia, no metabolic derangements, no renal insufficiency.  Her EKG shows no acute ischemic abnormalities and serial troponin are negative.  Her pain is atypical for cardiac etiology and I doubt ACS/MI at this time.  Her chest x-ray shows no acute cardiopulmonary abnormalities.  She is PERC negative and I have a low suspicion of PE.  Doubt dissection, cardiac tamponade, 70 rupture, pneumonia or pneumothorax.  On reevaluation patient is resting comfortably, reports little change in her symptoms after nitroglycerin, aspirin, and Toradol.  We will give a dose of IV pain medicine here in the ED.  Discussed that her symptoms are likely musculoskeletal in etiology and will discharge with symptomatic management.  Discussed appropriate use of anti-inflammatory medicines and muscle relaxants.  Recommend follow-up with PCP for reevaluation of symptoms.  Discussed strict ED return precautions.  Patient and daughter verbalized understanding of and agreement with plan and patient is stable for discharge at this time.  Discussed with Dr. Hyacinth Meeker who independently reviewed patient's EKG and agrees with assessment and plan.   Final Clinical Impression(s) / ED Diagnoses Final diagnoses:  Atypical chest pain  Acute chest wall pain    Rx / DC Orders ED Discharge Orders         Ordered    diclofenac (VOLTAREN) 75 MG EC tablet  2 times daily  01/06/20 1818    cyclobenzaprine  (FLEXERIL) 10 MG tablet  2 times daily PRN     01/06/20 1818    lidocaine (LMX) 4 % cream  3 times daily PRN,   Status:  Discontinued     01/06/20 1818    lidocaine (LMX) 4 % cream  3 times daily PRN     01/06/20 1819           Jeanie Sewer, PA-C 01/06/20 Zena Amos, MD 01/10/20 [phone]

## 2020-01-06 NOTE — ED Notes (Signed)
Pt is aware urine sample is needed, states she does not need to go at this time.

## 2020-01-06 NOTE — ED Notes (Signed)
Pt reports her father had heart disease in his 16 s  She reports pain inset 1030 and that she cam e here for eval of same

## 2020-01-06 NOTE — ED Notes (Signed)
Pt reports no difference in discomfort

## 2020-01-06 NOTE — ED Triage Notes (Signed)
Patient presents to the ED with left sided chest, with nausea that began at 1000 today.  Patient does not report a cardiac history.  Patient took TUMS with no relief.

## 2020-01-06 NOTE — ED Notes (Signed)
NTG given for complaint of pain 5/10

## 2020-01-06 NOTE — Discharge Instructions (Addendum)
Start taking Voltaren twice daily with meals.  Do not take ibuprofen, Advil, Aleve, meloxicam or Motrin with this medication.  You can take 1 to 2 tablets of extra strength Tylenol every 6 hours as needed for pain additionally.  You can take Flexeril as needed for muscle relaxation but do not drive, drink alcohol, or operate heavy machinery while taking this medicine as it can cause drowsiness.  Apply lidocaine cream to areas of pain.  You can apply heat 20 minutes at a time several times a day and do some gentle stretching to avoid muscle stiffness.  Your work-up today was reassuring with no signs of any life-threatening cause of your chest pain.  I suspect the pain is musculoskeletal in nature as there is significant pain upon pushing on the chest wall.  Please call your primary care provider first thing on Monday to schedule close follow-up.  Return to the emergency department if any concerning signs or symptoms develop such as worsening pain, vomiting, shortness of breath, loss of consciousness or fevers.

## 2020-06-02 ENCOUNTER — Ambulatory Visit
Admission: EM | Admit: 2020-06-02 | Discharge: 2020-06-02 | Disposition: A | Discharge status: Home / Self Care | Payer: 59 | Attending: Emergency Medicine | Admitting: Emergency Medicine

## 2020-06-02 ENCOUNTER — Other Ambulatory Visit: Payer: Self-pay

## 2020-06-02 ENCOUNTER — Encounter: Payer: Self-pay | Admitting: Emergency Medicine

## 2020-06-02 DIAGNOSIS — J029 Acute pharyngitis, unspecified: Secondary | ICD-10-CM

## 2020-06-02 DIAGNOSIS — Z1152 Encounter for screening for COVID-19: Secondary | ICD-10-CM | POA: Diagnosis present

## 2020-06-02 LAB — POCT RAPID STREP A (OFFICE): Rapid Strep A Screen: NEGATIVE

## 2020-06-02 MED ORDER — FLUTICASONE PROPIONATE 50 MCG/ACT NA SUSP: 1.0000 | Freq: Every day | NASAL | 0 refills | Status: AC

## 2020-06-02 MED ORDER — PREDNISONE 10 MG PO TABS: 20.0000 mg | ORAL_TABLET | Freq: Every day | ORAL | 0 refills | Status: DC

## 2020-06-02 MED ORDER — BENZONATATE 100 MG PO CAPS: 100.0000 mg | ORAL_CAPSULE | Freq: Three times a day (TID) | ORAL | 0 refills | Status: DC

## 2020-06-02 MED ORDER — CETIRIZINE HCL 10 MG PO TABS: 10.0000 mg | ORAL_TABLET | Freq: Every day | ORAL | 0 refills | Status: AC

## 2020-06-02 NOTE — Discharge Instructions (Addendum)
Strep test negative, will send out for culture and we will call you with results  COVID testing ordered.  It will take between 2-7 days for test results.  Someone will contact you regarding abnormal results.    In the meantime: You should remain isolated in your home for 10 days from symptom onset AND greater than 72 hours after symptoms resolution (absence of fever without the use of fever-reducing medication and improvement in respiratory symptoms), whichever is longer Get plenty of rest and push fluids Tessalon Perles prescribed for cough Zyrtec-D prescribed for nasal congestion, runny nose, and/or sore throat Flonase prescribed for nasal congestion and runny nose Low-dose prednisone was prescribed Use medications daily for symptom relief Use OTC medications like ibuprofen or tylenol as needed fever or pain Call or go to the ED if you have any new or worsening symptoms such as fever, worsening cough, shortness of breath, chest tightness, chest pain, turning blue, changes in mental status, etc..Marland Kitchen

## 2020-06-02 NOTE — ED Triage Notes (Signed)
Headache and sore throat x 2 weeks.  Has had covid vaccines

## 2020-06-02 NOTE — ED Provider Notes (Signed)
[facility]ENTER   [id number] 06/02/20 Arrival Time: 1106  Chief Complaint  Patient presents with  . Headache     SUBJECTIVE: History from: patient.  Amy Durham is a 44 y.o. female who presented to the urgent care with a complaint of headache and sore throat for the past 2 weeks.  Denies sick exposure to strep, flu or mono, or precipitating event.  Has tried OTC medication without relief.  Symptoms are made worse with swallowing, but tolerating liquids and own secretions without difficulty.  Denies previous symptoms in the past.   Denies fever, chills, fatigue, ear pain, sinus pain, rhinorrhea, nasal congestion, cough, SOB, wheezing, chest pain, nausea, rash, changes in bowel or bladder habits.    ROS: As per HPI.  All other pertinent ROS negative.     Past Medical History:  Diagnosis Date  . Hypertension   . Seasonal allergies    Past Surgical History:  Procedure Laterality Date  . BIOPSY  06/07/2015   Procedure: BIOPSY (GASTRIC);  Surgeon: West Bali, MD;  Location: [facility];  Service: Endoscopy;;  . CHOLECYSTECTOMY N/A 05/22/2015   Procedure: LAPAROSCOPIC CHOLECYSTECTOMY;  Surgeon: Franky Macho, MD;  Location: [facility];  Service: General;  Laterality: N/A;  . ERCP N/A 06/07/2015   Procedure: ENDOSCOPIC RETROGRADE CHOLANGIOPANCREATOGRAPHY (ERCP);  Surgeon: West Bali, MD;  Location: [facility];  Service: Endoscopy;  Laterality: N/A;  . REMOVAL OF STONES N/A 06/07/2015   Procedure: REMOVAL OF COMMON BILE DUCT STONES (3);  Surgeon: West Bali, MD;  Location: [facility];  Service: Endoscopy;  Laterality: N/A;  . SPHINCTEROTOMY N/A 06/07/2015   Procedure: SPHINCTEROTOMY;  Surgeon: West Bali, MD;  Location: [facility];  Service: Endoscopy;  Laterality: N/A;   Allergies  Allergen Reactions  . Penicillins     unknown   No current facility-administered medications on file prior to encounter.   Current Outpatient Medications on File Prior to Encounter  Medication Sig Dispense  Refill  . cyclobenzaprine (FLEXERIL) 10 MG tablet Take 1 tablet (10 mg total) by mouth 2 (two) times daily as needed for muscle spasms. 20 tablet 0  . desogestrel-ethinyl estradiol (APRI,EMOQUETTE,SOLIA) 0.15-30 MG-MCG tablet Take 1 tablet by mouth daily.      . diclofenac (VOLTAREN) 75 MG EC tablet Take 1 tablet (75 mg total) by mouth 2 (two) times daily. 30 tablet 0  . escitalopram (LEXAPRO) 10 MG tablet Take 10 mg by mouth daily.    Marland Kitchen ibuprofen (ADVIL,MOTRIN) 200 MG tablet Take 600 mg by mouth every 6 (six) hours as needed for headache.    . lidocaine (LMX) 4 % cream Apply 1 application topically 3 (three) times daily as needed. 30 g 0  . lisinopril (ZESTRIL) 10 MG tablet Take 10 mg by mouth daily.    Marland Kitchen loratadine (CLARITIN) 10 MG tablet Take 10 mg by mouth daily.    . nitrofurantoin, macrocrystal-monohydrate, (MACROBID) 100 MG capsule Take 100 mg by mouth 2 (two) times daily.     Social History   Socioeconomic History  . Marital status: Married    Spouse name: Not on file  . Number of children: Not on file  . Years of education: Not on file  . Highest education level: Not on file  Occupational History  . Not on file  Tobacco Use  . Smoking status: Never Smoker  . Smokeless tobacco: Never Used  Substance and Sexual Activity  . Alcohol use: No  . Drug use: No  . Sexual activity:  Yes    Birth control/protection: Pill  Other Topics Concern  . Not on file  Social History Narrative  . Not on file   Social Determinants of Health   Financial Resource Strain:   . Difficulty of Paying Living Expenses: Not on file  Food Insecurity:   . Worried About Programme researcher, broadcasting/film/video in the Last Year: Not on file  . Ran Out of Food in the Last Year: Not on file  Transportation Needs:   . Lack of Transportation (Medical): Not on file  . Lack of Transportation (Non-Medical): Not on file  Physical Activity:   . Days of Exercise per Week: Not on file  . Minutes of Exercise per Session: Not on  file  Stress:   . Feeling of Stress : Not on file  Social Connections:   . Frequency of Communication with Friends and Family: Not on file  . Frequency of Social Gatherings with Friends and Family: Not on file  . Attends Religious Services: Not on file  . Active Member of Clubs or Organizations: Not on file  . Attends Banker Meetings: Not on file  . Marital Status: Not on file  Intimate Partner Violence:   . Fear of Current or Ex-Partner: Not on file  . Emotionally Abused: Not on file  . Physically Abused: Not on file  . Sexually Abused: Not on file   No family history on file.  OBJECTIVE:  Vitals:   06/02/20 1119 06/02/20 1134  BP: 119/77   Pulse: 79   Resp: 16   Temp: 98.2 F (36.8 C)   TempSrc: Oral   SpO2: 97%   Weight:  225 lb (102.1 kg)  Height:  5\' 8"  (1.727 m)     General appearance: alert; appears fatigued, but nontoxic, speaking in full sentences and managing own secretions HEENT: NCAT; Ears: EACs clear, TMs pearly gray with visible cone of light, without erythema; Eyes: PERRL, EOMI grossly; Nose: no obvious rhinorrhea; Throat: oropharynx clear, tonsils 2+ and mildly erythematous without white tonsillar exudates, uvula midline Neck: supple without LAD Lungs: CTA bilaterally without adventitious breath sounds; cough: mild Heart: regular rate and rhythm.  Radial pulses 2+ symmetrical bilaterally Skin: warm and dry Psychological: alert and cooperative; normal mood and affect  LABS: Results for orders placed or performed during the hospital encounter of 06/02/20 (from the past 24 hour(s))  POCT rapid strep A     Status: None   Collection Time: 06/02/20 11:45 AM  Result Value Ref Range   Rapid Strep A Screen Negative Negative     ASSESSMENT & PLAN:  1. Sore throat   2. Encounter for screening for COVID-19     Meds ordered this encounter  Medications  . cetirizine (ZYRTEC ALLERGY) 10 MG tablet    Sig: Take 1 tablet (10 mg total) by mouth  daily.    Dispense:  30 tablet    Refill:  0  . fluticasone (FLONASE) 50 MCG/ACT nasal spray    Sig: Place 1 spray into both nostrils daily for 14 days.    Dispense:  16 g    Refill:  0  . benzonatate (TESSALON) 100 MG capsule    Sig: Take 1 capsule (100 mg total) by mouth every 8 (eight) hours.    Dispense:  30 capsule    Refill:  0  . predniSONE (DELTASONE) 10 MG tablet    Sig: Take 2 tablets (20 mg total) by mouth daily.    Dispense:  14  tablet    Refill:  0    Discharge Instructions  Strep test negative, will send out for culture and we will call you with results  COVID testing ordered.  It will take between 2-7 days for test results.  Someone will contact you regarding abnormal results.    In the meantime: You should remain isolated in your home for 10 days from symptom onset AND greater than 72 hours after symptoms resolution (absence of fever without the use of fever-reducing medication and improvement in respiratory symptoms), whichever is longer Get plenty of rest and push fluids Tessalon Perles prescribed for cough Zyrtec-D prescribed for nasal congestion, runny nose, and/or sore throat Flonase prescribed for nasal congestion and runny nose Low-dose prednisone was prescribed Use medications daily for symptom relief Use OTC medications like ibuprofen or tylenol as needed fever or pain Call or go to the ED if you have any new or worsening symptoms such as fever, worsening cough, shortness of breath, chest tightness, chest pain, turning blue, changes in mental status, etc...  Reviewed expectations re: course of current medical issues. Questions answered. Outlined signs and symptoms indicating need for more acute intervention. Patient verbalized understanding. After Visit Summary given.      Note: This document was prepared using Dragon voice recognition software and may include unintentional dictation errors.    Durward Parcel, FNP 06/02/20 1205

## 2020-06-03 LAB — NOVEL CORONAVIRUS, NAA: SARS-CoV-2, NAA: NOT DETECTED

## 2020-06-04 LAB — CULTURE, GROUP A STREP (THRC): Special Requests: NORMAL

## 2020-06-14 ENCOUNTER — Ambulatory Visit (INDEPENDENT_AMBULATORY_CARE_PROVIDER_SITE_OTHER): Payer: 59

## 2020-06-14 ENCOUNTER — Encounter: Payer: Self-pay | Admitting: Emergency Medicine

## 2020-06-14 ENCOUNTER — Ambulatory Visit
Admission: EM | Admit: 2020-06-14 | Discharge: 2020-06-14 | Disposition: A | Discharge status: Home / Self Care | Payer: 59 | Attending: Emergency Medicine | Admitting: Emergency Medicine

## 2020-06-14 ENCOUNTER — Other Ambulatory Visit: Payer: Self-pay

## 2020-06-14 DIAGNOSIS — R0602 Shortness of breath: Secondary | ICD-10-CM

## 2020-06-14 DIAGNOSIS — R0989 Other specified symptoms and signs involving the circulatory and respiratory systems: Secondary | ICD-10-CM | POA: Diagnosis not present

## 2020-06-14 MED ORDER — ALBUTEROL SULFATE HFA 108 (90 BASE) MCG/ACT IN AERS: 1.0000 | INHALATION_SPRAY | Freq: Four times a day (QID) | RESPIRATORY_TRACT | 0 refills | Status: AC | PRN

## 2020-06-14 NOTE — ED Triage Notes (Signed)
Seen here on 08/29 for same s/s. Had neg covid and strep test. Had another covid test for work the next day that was negative.

## 2020-06-14 NOTE — ED Provider Notes (Addendum)
[facility]   [id number] 06/14/20 Arrival Time: 1617   CC: URI  SUBJECTIVE: History from: patient.  Amy Durham is a 44 y.o. female who presented to the urgent care with a complaint of cough and chest congestion and shortness of breath for the past few weeks. Has tested negative for COVID-19 on 06/02/2020. Denies sick exposure to COVID, flu or strep.  Denies recent travel.  Has tried Tessalon, Zyrtec, Flonase and steroid with mild relief.. Denies aggravating factor. Denies previous symptoms in the past.   Denies fever, chills, fatigue, sinus pain, rhinorrhea, sore throat, SOB, wheezing, chest pain, nausea, changes in bowel or bladder habits.     ROS: As per HPI.  All other pertinent ROS negative.     Past Medical History:  Diagnosis Date  . Hypertension   . Seasonal allergies    Past Surgical History:  Procedure Laterality Date  . BIOPSY  06/07/2015   Procedure: BIOPSY (GASTRIC);  Surgeon: West Bali, MD;  Location: [facility];  Service: Endoscopy;;  . CHOLECYSTECTOMY N/A 05/22/2015   Procedure: LAPAROSCOPIC CHOLECYSTECTOMY;  Surgeon: Franky Macho, MD;  Location: [facility];  Service: General;  Laterality: N/A;  . ERCP N/A 06/07/2015   Procedure: ENDOSCOPIC RETROGRADE CHOLANGIOPANCREATOGRAPHY (ERCP);  Surgeon: West Bali, MD;  Location: [facility];  Service: Endoscopy;  Laterality: N/A;  . REMOVAL OF STONES N/A 06/07/2015   Procedure: REMOVAL OF COMMON BILE DUCT STONES (3);  Surgeon: West Bali, MD;  Location: [facility];  Service: Endoscopy;  Laterality: N/A;  . SPHINCTEROTOMY N/A 06/07/2015   Procedure: SPHINCTEROTOMY;  Surgeon: West Bali, MD;  Location: [facility];  Service: Endoscopy;  Laterality: N/A;   Allergies  Allergen Reactions  . Penicillins     unknown   No current facility-administered medications on file prior to encounter.   Current Outpatient Medications on File Prior to Encounter  Medication Sig Dispense Refill  . benzonatate (TESSALON) 100 MG capsule Take 1  capsule (100 mg total) by mouth every 8 (eight) hours. 30 capsule 0  . cetirizine (ZYRTEC ALLERGY) 10 MG tablet Take 1 tablet (10 mg total) by mouth daily. 30 tablet 0  . cyclobenzaprine (FLEXERIL) 10 MG tablet Take 1 tablet (10 mg total) by mouth 2 (two) times daily as needed for muscle spasms. 20 tablet 0  . desogestrel-ethinyl estradiol (APRI,EMOQUETTE,SOLIA) 0.15-30 MG-MCG tablet Take 1 tablet by mouth daily.      . diclofenac (VOLTAREN) 75 MG EC tablet Take 1 tablet (75 mg total) by mouth 2 (two) times daily. 30 tablet 0  . escitalopram (LEXAPRO) 10 MG tablet Take 10 mg by mouth daily.    . fluticasone (FLONASE) 50 MCG/ACT nasal spray Place 1 spray into both nostrils daily for 14 days. 16 g 0  . ibuprofen (ADVIL,MOTRIN) 200 MG tablet Take 600 mg by mouth every 6 (six) hours as needed for headache.    . lidocaine (LMX) 4 % cream Apply 1 application topically 3 (three) times daily as needed. 30 g 0  . lisinopril (ZESTRIL) 10 MG tablet Take 10 mg by mouth daily.    Marland Kitchen loratadine (CLARITIN) 10 MG tablet Take 10 mg by mouth daily.    . nitrofurantoin, macrocrystal-monohydrate, (MACROBID) 100 MG capsule Take 100 mg by mouth 2 (two) times daily.    . predniSONE (DELTASONE) 10 MG tablet Take 2 tablets (20 mg total) by mouth daily. 14 tablet 0   Social History   Socioeconomic History  . Marital status: Married  Spouse name: Not on file  . Number of children: Not on file  . Years of education: Not on file  . Highest education level: Not on file  Occupational History  . Not on file  Tobacco Use  . Smoking status: Never Smoker  . Smokeless tobacco: Never Used  Substance and Sexual Activity  . Alcohol use: No  . Drug use: No  . Sexual activity: Yes    Birth control/protection: Pill  Other Topics Concern  . Not on file  Social History Narrative  . Not on file   Social Determinants of Health   Financial Resource Strain:   . Difficulty of Paying Living Expenses: Not on file  Food  Insecurity:   . Worried About Programme researcher, broadcasting/film/video in the Last Year: Not on file  . Ran Out of Food in the Last Year: Not on file  Transportation Needs:   . Lack of Transportation (Medical): Not on file  . Lack of Transportation (Non-Medical): Not on file  Physical Activity:   . Days of Exercise per Week: Not on file  . Minutes of Exercise per Session: Not on file  Stress:   . Feeling of Stress : Not on file  Social Connections:   . Frequency of Communication with Friends and Family: Not on file  . Frequency of Social Gatherings with Friends and Family: Not on file  . Attends Religious Services: Not on file  . Active Member of Clubs or Organizations: Not on file  . Attends Banker Meetings: Not on file  . Marital Status: Not on file  Intimate Partner Violence:   . Fear of Current or Ex-Partner: Not on file  . Emotionally Abused: Not on file  . Physically Abused: Not on file  . Sexually Abused: Not on file   No family history on file.  OBJECTIVE:  Vitals:   06/14/20 1705  BP: 118/77  Pulse: 84  Resp: 18  Temp: 98.5 F (36.9 C)  TempSrc: Oral  SpO2: 96%  Weight: 225 lb 1.4 oz (102.1 kg)  Height: 5\' 8"  (1.727 m)     General appearance: alert; appears fatigued, but nontoxic; speaking in full sentences and tolerating own secretions HEENT: NCAT; Ears: EACs clear, TMs pearly gray; Eyes: PERRL.  EOM grossly intact. Sinuses: nontender; Nose: nares patent without rhinorrhea, Throat: oropharynx clear, tonsils non erythematous or enlarged, uvula midline  Neck: supple without LAD Lungs: unlabored respirations, symmetrical air entry; cough: absent; no respiratory distress; CTAB Heart: regular rate and rhythm.  Radial pulses 2+ symmetrical bilaterally Skin: warm and dry Psychological: alert and cooperative; normal mood and affect  LABS:  No results found for this or any previous visit (from the past 24 hour(s)).    RADIOLOGY:  DG Chest 2 View  Result Date:  06/14/2020 CLINICAL DATA:  Shortness of breath EXAM: CHEST - 2 VIEW COMPARISON:  01/06/2020 FINDINGS: The heart size and mediastinal contours are within normal limits. Both lungs are clear. The visualized skeletal structures are unremarkable. IMPRESSION: No active cardiopulmonary disease. Electronically Signed   By: 03/07/2020 M.D.   On: 06/14/2020 17:46   Chest x-ray is negative for acute pulmonary disease.  I have reviewed the x-ray myself and the radiologist interpretation.  I am in agreement with the radiologist interpretation.  ASSESSMENT & PLAN:  1. SOB (shortness of breath)   2. Chest congestion     Meds ordered this encounter  Medications  . albuterol (VENTOLIN HFA) 108 (90 Base) MCG/ACT inhaler  Sig: Inhale 1-2 puffs into the lungs every 6 (six) hours as needed for wheezing or shortness of breath.    Dispense:  18 g    Refill:  0    Discharge Instructions  Get plenty of rest and push fluids ProAir was prescribed for  shortness of breath Use medications daily for symptom relief Use OTC medications like ibuprofen or tylenol as needed fever or pain Call or go to the ED if you have any new or worsening symptoms such as fever, worsening cough, shortness of breath, chest tightness, chest pain, turning blue, changes in mental status, etc...   Reviewed expectations re: course of current medical issues. Questions answered. Outlined signs and symptoms indicating need for more acute intervention. Patient verbalized understanding. After Visit Summary given.    Note: This document was prepared using Dragon voice recognition software and may include unintentional dictation errors.      Durward Parcel, FNP 06/14/20 1823    Durward Parcel, FNP 06/14/20 1825

## 2020-06-14 NOTE — Discharge Instructions (Addendum)
  Get plenty of rest and push fluids ProAir was prescribed for  shortness of breath Use medications daily for symptom relief Use OTC medications like ibuprofen or tylenol as needed fever or pain Call or go to the ED if you have any new or worsening symptoms such as fever, worsening cough, shortness of breath, chest tightness, chest pain, turning blue, changes in mental status, etc..Marland Kitchen

## 2020-07-09 ENCOUNTER — Other Ambulatory Visit (HOSPITAL_COMMUNITY)
Admission: RE | Admit: 2020-07-09 | Discharge: 2020-07-09 | Disposition: A | Discharge status: Home / Self Care | Payer: 59 | Source: Ambulatory Visit | Attending: Internal Medicine | Admitting: Internal Medicine

## 2020-07-09 ENCOUNTER — Other Ambulatory Visit: Payer: Self-pay

## 2020-07-09 DIAGNOSIS — J029 Acute pharyngitis, unspecified: Secondary | ICD-10-CM | POA: Insufficient documentation

## 2020-07-09 DIAGNOSIS — E782 Mixed hyperlipidemia: Secondary | ICD-10-CM | POA: Diagnosis present

## 2020-07-09 DIAGNOSIS — L237 Allergic contact dermatitis due to plants, except food: Secondary | ICD-10-CM | POA: Diagnosis present

## 2020-07-09 DIAGNOSIS — F419 Anxiety disorder, unspecified: Secondary | ICD-10-CM | POA: Diagnosis present

## 2020-07-09 DIAGNOSIS — N390 Urinary tract infection, site not specified: Secondary | ICD-10-CM | POA: Insufficient documentation

## 2020-07-09 DIAGNOSIS — R509 Fever, unspecified: Secondary | ICD-10-CM | POA: Insufficient documentation

## 2020-07-09 DIAGNOSIS — J069 Acute upper respiratory infection, unspecified: Secondary | ICD-10-CM | POA: Insufficient documentation

## 2020-07-09 DIAGNOSIS — B001 Herpesviral vesicular dermatitis: Secondary | ICD-10-CM | POA: Diagnosis present

## 2020-07-09 DIAGNOSIS — J019 Acute sinusitis, unspecified: Secondary | ICD-10-CM | POA: Insufficient documentation

## 2020-07-09 DIAGNOSIS — Z6832 Body mass index (BMI) 32.0-32.9, adult: Secondary | ICD-10-CM | POA: Diagnosis present

## 2020-07-09 DIAGNOSIS — Z6830 Body mass index (BMI) 30.0-30.9, adult: Secondary | ICD-10-CM | POA: Insufficient documentation

## 2020-07-09 DIAGNOSIS — R0982 Postnasal drip: Secondary | ICD-10-CM | POA: Insufficient documentation

## 2020-07-09 LAB — CBC WITH DIFFERENTIAL/PLATELET
Abs Immature Granulocytes: 0.02 10*3/uL (ref 0.00–0.07)
Basophils Absolute: 0.1 10*3/uL (ref 0.0–0.1)
Basophils Relative: 1 %
Eosinophils Absolute: 0.2 10*3/uL (ref 0.0–0.5)
Eosinophils Relative: 3 %
HCT: 43.8 % (ref 36.0–46.0)
Hemoglobin: 14.7 g/dL (ref 12.0–15.0)
Immature Granulocytes: 0 %
Lymphocytes Relative: 41 %
Lymphs Abs: 3.1 10*3/uL (ref 0.7–4.0)
MCH: 32.8 pg (ref 26.0–34.0)
MCHC: 33.6 g/dL (ref 30.0–36.0)
MCV: 97.8 fL (ref 80.0–100.0)
Monocytes Absolute: 0.7 10*3/uL (ref 0.1–1.0)
Monocytes Relative: 10 %
Neutro Abs: 3.5 10*3/uL (ref 1.7–7.7)
Neutrophils Relative %: 45 %
Platelets: 323 10*3/uL (ref 150–400)
RBC: 4.48 MIL/uL (ref 3.87–5.11)
RDW: 12.4 % (ref 11.5–15.5)
WBC: 7.7 10*3/uL (ref 4.0–10.5)
nRBC: 0 % (ref 0.0–0.2)

## 2020-07-09 LAB — LIPID PANEL
Cholesterol: 219 mg/dL — ABNORMAL HIGH (ref 0–200)
HDL: 33 mg/dL — ABNORMAL LOW (ref >40)
LDL Cholesterol: 163 mg/dL — ABNORMAL HIGH (ref 0–99)
Total CHOL/HDL Ratio: 6.6 RATIO
Triglycerides: 116 mg/dL (ref <150)
VLDL: 23 mg/dL (ref 0–40)

## 2020-07-09 LAB — COMPREHENSIVE METABOLIC PANEL
ALT: 20 U/L (ref 0–44)
AST: 20 U/L (ref 15–41)
Albumin: 4.1 g/dL (ref 3.5–5.0)
Alkaline Phosphatase: 70 U/L (ref 38–126)
Anion gap: 8 (ref 5–15)
BUN: 11 mg/dL (ref 6–20)
CO2: 29 mmol/L (ref 22–32)
Calcium: 9 mg/dL (ref 8.9–10.3)
Chloride: 99 mmol/L (ref 98–111)
Creatinine, Ser: 0.88 mg/dL (ref 0.44–1.00)
GFR calc non Af Amer: >60 mL/min (ref >60)
Glucose, Bld: 104 mg/dL — ABNORMAL HIGH (ref 70–99)
Potassium: 4.2 mmol/L (ref 3.5–5.1)
Sodium: 136 mmol/L (ref 135–145)
Total Bilirubin: 0.9 mg/dL (ref 0.3–1.2)
Total Protein: 6.9 g/dL (ref 6.5–8.1)

## 2020-07-11 ENCOUNTER — Other Ambulatory Visit (HOSPITAL_COMMUNITY): Payer: Self-pay | Admitting: Internal Medicine

## 2020-07-11 DIAGNOSIS — Z1231 Encounter for screening mammogram for malignant neoplasm of breast: Secondary | ICD-10-CM

## 2020-07-24 ENCOUNTER — Other Ambulatory Visit: Payer: Self-pay

## 2020-07-24 ENCOUNTER — Ambulatory Visit (HOSPITAL_COMMUNITY)
Admission: RE | Admit: 2020-07-24 | Discharge: 2020-07-24 | Disposition: A | Discharge status: Home / Self Care | Payer: 59 | Source: Ambulatory Visit | Attending: Internal Medicine | Admitting: Internal Medicine

## 2020-07-24 ENCOUNTER — Encounter (HOSPITAL_COMMUNITY): Payer: Self-pay

## 2020-07-24 DIAGNOSIS — Z1231 Encounter for screening mammogram for malignant neoplasm of breast: Secondary | ICD-10-CM | POA: Diagnosis present

## 2020-09-18 ENCOUNTER — Ambulatory Visit (INDEPENDENT_AMBULATORY_CARE_PROVIDER_SITE_OTHER): Payer: 59 | Admitting: Podiatry

## 2020-09-18 ENCOUNTER — Other Ambulatory Visit: Payer: Self-pay

## 2020-09-18 DIAGNOSIS — M722 Plantar fascial fibromatosis: Secondary | ICD-10-CM | POA: Diagnosis not present

## 2020-09-18 MED ORDER — METHYLPREDNISOLONE 4 MG PO TBPK: ORAL_TABLET | ORAL | 0 refills | Status: DC

## 2020-10-04 ENCOUNTER — Ambulatory Visit
Admission: EM | Admit: 2020-10-04 | Discharge: 2020-10-04 | Disposition: A | Discharge status: Home / Self Care | Payer: 59 | Attending: Family Medicine | Admitting: Family Medicine

## 2020-10-04 ENCOUNTER — Other Ambulatory Visit: Payer: Self-pay

## 2020-10-04 DIAGNOSIS — R300 Dysuria: Secondary | ICD-10-CM | POA: Insufficient documentation

## 2020-10-04 LAB — POCT URINALYSIS DIP (MANUAL ENTRY)
Bilirubin, UA: NEGATIVE
Glucose, UA: NEGATIVE mg/dL
Ketones, POC UA: NEGATIVE mg/dL
Nitrite, UA: NEGATIVE
Protein Ur, POC: 30 mg/dL — AB
Spec Grav, UA: 1.01 (ref 1.010–1.025)
Urobilinogen, UA: 0.2 E.U./dL
pH, UA: 6 (ref 5.0–8.0)

## 2020-10-04 MED ORDER — NITROFURANTOIN MONOHYD MACRO 100 MG PO CAPS: 100.0000 mg | ORAL_CAPSULE | Freq: Two times a day (BID) | ORAL | 0 refills | Status: DC

## 2020-10-04 NOTE — Discharge Instructions (Signed)
Treating you for a UTI Take the medicine as prescribed.  Push fluids  Follow up as needed for continued or worsening symptoms

## 2020-10-04 NOTE — ED Triage Notes (Signed)
PT PRESENTS WITH C/O URINARY BURNING AND FREQUENCY SINCE Monday. HAS BEEN TAKING AZO

## 2020-10-06 LAB — URINE CULTURE

## 2020-10-06 MED ORDER — BETAMETHASONE SOD PHOS & ACET 6 (3-3) MG/ML IJ SUSP: 3.0000 mg | Freq: Once | INTRAMUSCULAR | Status: AC

## 2020-10-06 NOTE — Progress Notes (Signed)
   Subjective: 45 y.o. female presenting today for recurrence of right heel pain.  Patient states that the right plantar fasciitis has flared up again over the past few weeks.  She was dealing with this issue approximately 1 year ago and she was doing very well up until about 1 month prior to today's visit.  She denies any injury and has not been doing anything for treatment.  She presents for further treatment evaluation    Past Medical History:  Diagnosis Date  . Hypertension   . Seasonal allergies      Objective: Physical Exam General: The patient is alert and oriented x3 in no acute distress.  Dermatology: Skin is warm, dry and supple bilateral lower extremities. Negative for open lesions or macerations bilateral.   Vascular: Dorsalis Pedis and Posterior Tibial pulses palpable bilateral.  Capillary fill time is immediate to all digits.  Neurological: Epicritic and protective threshold intact bilateral.   Musculoskeletal: Tenderness to palpation to the plantar aspect of the right heel along the plantar fascia. All other joints range of motion within normal limits bilateral. Strength 5/5 in all groups bilateral.   Assessment: 1. plantar fasciitis right 2.  History of plantar fasciitis bilateral  Plan of Care:  1. Patient evaluated.   2. Injection of 0.5cc Celestone soluspan injected into the right heel  3.  Prescription for Medrol Dosepak.  Patient cannot take NSAIDs secondary to GI upset 4.  Plantar fascial brace dispensed 5. Instructed patient regarding therapies and modalities at home to alleviate symptoms.  6. Return to clinic 4 weeks  *Goes by Amy Durham.  Does COVID screening at [facility]   Felecia Shelling, DPM [facility]  Dr. Felecia Shelling, DPM    [address] [place], [place] [address]                Office [phone]  Fax [phone]

## 2020-10-07 NOTE — ED Provider Notes (Signed)
[facility]    CSN: [id number] Arrival date & time: 10/04/20  3267      History   Chief Complaint Chief Complaint  Patient presents with  . Dysuria    HPI KATHELEEN STELLA is a 45 y.o. female.   Patient is a 45 year old female who presents today with dysuria, urinary frequency since Monday.  Symptoms have been constant.  She has been using AZO with some relief of her symptoms.  No fever, chills, back pain, nausea.     Past Medical History:  Diagnosis Date  . Hypertension   . Seasonal allergies     Patient Active Problem List   Diagnosis Date Noted  . Common bile duct stone     Past Surgical History:  Procedure Laterality Date  . BIOPSY  06/07/2015   Procedure: BIOPSY (GASTRIC);  Surgeon: Danie Binder, MD;  Location: [facility];  Service: Endoscopy;;  . CHOLECYSTECTOMY N/A 05/22/2015   Procedure: LAPAROSCOPIC CHOLECYSTECTOMY;  Surgeon: Aviva Signs, MD;  Location: [facility];  Service: General;  Laterality: N/A;  . ERCP N/A 06/07/2015   Procedure: ENDOSCOPIC RETROGRADE CHOLANGIOPANCREATOGRAPHY (ERCP);  Surgeon: Danie Binder, MD;  Location: [facility];  Service: Endoscopy;  Laterality: N/A;  . REMOVAL OF STONES N/A 06/07/2015   Procedure: REMOVAL OF COMMON BILE DUCT STONES (3);  Surgeon: Danie Binder, MD;  Location: [facility];  Service: Endoscopy;  Laterality: N/A;  . SPHINCTEROTOMY N/A 06/07/2015   Procedure: SPHINCTEROTOMY;  Surgeon: Danie Binder, MD;  Location: [facility];  Service: Endoscopy;  Laterality: N/A;    OB History   No obstetric history on file.      Home Medications    Prior to Admission medications   Medication Sig Start Date End Date Taking? Authorizing Provider  nitrofurantoin, macrocrystal-monohydrate, (MACROBID) 100 MG capsule Take 1 capsule (100 mg total) by mouth 2 (two) times daily. 10/04/20  Yes Stephinie Battisti A, NP  albuterol (VENTOLIN HFA) 108 (90 Base) MCG/ACT inhaler Inhale 1-2 puffs into the lungs every 6 (six) hours as needed for wheezing or  shortness of breath. 06/14/20   Avegno, Darrelyn Hillock, FNP  benzonatate (TESSALON) 100 MG capsule Take 1 capsule (100 mg total) by mouth every 8 (eight) hours. 06/02/20   Avegno, Darrelyn Hillock, FNP  cetirizine (ZYRTEC ALLERGY) 10 MG tablet Take 1 tablet (10 mg total) by mouth daily. 06/02/20   Avegno, Darrelyn Hillock, FNP  cyclobenzaprine (FLEXERIL) 10 MG tablet Take 1 tablet (10 mg total) by mouth 2 (two) times daily as needed for muscle spasms. 01/06/20   Rodell Perna A, PA-C  desogestrel-ethinyl estradiol (APRI,EMOQUETTE,SOLIA) 0.15-30 MG-MCG tablet Take 1 tablet by mouth daily.    [provider]  diclofenac (VOLTAREN) 75 MG EC tablet Take 1 tablet (75 mg total) by mouth 2 (two) times daily. 01/06/20   Fawze, Mina A, PA-C  escitalopram (LEXAPRO) 10 MG tablet Take 10 mg by mouth daily. 08/18/19   [provider]  fexofenadine (ALLEGRA) 180 MG tablet Take by mouth.    [provider]  fluticasone (FLONASE) 50 MCG/ACT nasal spray Place 1 spray into both nostrils daily for 14 days. 06/02/20 06/16/20  Avegno, Darrelyn Hillock, FNP  ibuprofen (ADVIL,MOTRIN) 200 MG tablet Take 600 mg by mouth every 6 (six) hours as needed for headache.    [provider]  Lactobacillus Rhamnosus, GG, (RA PROBIOTIC DIGESTIVE CARE) CAPS Take 1 capsule by mouth daily.    [provider]  levonorgestrel (MIRENA) 20 MCG/24HR IUD by Intrauterine  route.    [provider]  lisinopril (ZESTRIL) 10 MG tablet Take 10 mg by mouth daily. 07/03/19   [provider]  loratadine (CLARITIN) 10 MG tablet Take 10 mg by mouth daily.    [provider]  Omega-3 1000 MG CAPS Take by mouth.    [provider]  valACYclovir (VALTREX) 500 MG tablet TAKE 1 TABLET BY MOUTH TWICE DAILY FOR 4 DAYS THEN AS NEEDED 11/04/19   [provider]    Family History Family History  Problem Relation Age of Onset  . Breast cancer Mother     Social History Social History   Tobacco Use  .  Smoking status: Never Smoker  . Smokeless tobacco: Never Used  Substance Use Topics  . Alcohol use: No  . Drug use: No     Allergies   Penicillins   Review of Systems Review of Systems   Physical Exam Triage Vital Signs ED Triage Vitals  Enc Vitals Group     BP 10/04/20 0837 109/74     Pulse Rate 10/04/20 0837 78     Resp 10/04/20 0837 15     Temp 10/04/20 0837 98 F (36.7 C)     Temp src --      SpO2 10/04/20 0837 97 %     Weight --      Height --      Head Circumference --      Peak Flow --      Pain Score 10/04/20 0842 0     Pain Loc --      Pain Edu? --      Excl. in GC? --    No data found.  Updated Vital Signs BP 109/74   Pulse 78   Temp 98 F (36.7 C)   Resp 15   SpO2 97%   Visual Acuity Right Eye Distance:   Left Eye Distance:   Bilateral Distance:    Right Eye Near:   Left Eye Near:    Bilateral Near:     Physical Exam Vitals and nursing note reviewed.  Constitutional:      General: She is not in acute distress.    Appearance: Normal appearance. She is not ill-appearing, toxic-appearing or diaphoretic.  HENT:     Head: Normocephalic.     Nose: Nose normal.  Eyes:     Conjunctiva/sclera: Conjunctivae normal.  Pulmonary:     Effort: Pulmonary effort is normal.  Musculoskeletal:        General: Normal range of motion.     Cervical back: Normal range of motion.  Skin:    General: Skin is warm and dry.     Findings: No rash.  Neurological:     Mental Status: She is alert.  Psychiatric:        Mood and Affect: Mood normal.      UC Treatments / Results  Labs (all labs ordered are listed, but only abnormal results are displayed) Labs Reviewed  URINE CULTURE - Abnormal; Notable for the following components:      Result Value   Culture MULTIPLE SPECIES PRESENT, SUGGEST RECOLLECTION (*)    All other components within normal limits  POCT URINALYSIS DIP (MANUAL ENTRY) - Abnormal; Notable for the following components:   Clarity, UA  cloudy (*)    Blood, UA large (*)    Protein Ur, POC =30 (*)    Leukocytes, UA Moderate (2+) (*)    All other components within normal limits  EKG   Radiology No results found.  Procedures Procedures (including critical care time)  Medications Ordered in UC Medications - No data to display  Initial Impression / Assessment and Plan / UC Course  I have reviewed the triage vital signs and the nursing notes.  Pertinent labs & imaging results that were available during my care of the patient were reviewed by me and considered in my medical decision making (see chart for details).     Dysuria Urine with moderate leuks and large blood.  Sending for culture. Treating with Macrobid pending culture Recommend push fluids Follow up as needed for continued or worsening symptoms  Final Clinical Impressions(s) / UC Diagnoses   Final diagnoses:  Dysuria     Discharge Instructions     Treating you for a UTI Take the medicine as prescribed.  Push fluids  Follow up as needed for continued or worsening symptoms     ED Prescriptions    Medication Sig Dispense Auth. Provider   nitrofurantoin, macrocrystal-monohydrate, (MACROBID) 100 MG capsule Take 1 capsule (100 mg total) by mouth 2 (two) times daily. 10 capsule Dahlia Byes A, NP     PDMP not reviewed this encounter.   Janace Aris, NP 10/07/20 [phone]

## 2020-10-28 ENCOUNTER — Other Ambulatory Visit: Payer: Self-pay

## 2020-10-28 ENCOUNTER — Ambulatory Visit: Payer: Self-pay | Admitting: Podiatry

## 2020-10-28 ENCOUNTER — Ambulatory Visit (INDEPENDENT_AMBULATORY_CARE_PROVIDER_SITE_OTHER): Payer: Managed Care, Other (non HMO) | Admitting: Podiatry

## 2020-10-28 DIAGNOSIS — M722 Plantar fascial fibromatosis: Secondary | ICD-10-CM

## 2020-11-11 MED ORDER — BETAMETHASONE SOD PHOS & ACET 6 (3-3) MG/ML IJ SUSP: 3.0000 mg | Freq: Once | INTRAMUSCULAR | Status: AC

## 2020-11-11 NOTE — Progress Notes (Signed)
   Subjective: 45 y.o. female presenting today for recurrence of right heel pain.  Patient states there is some improvement however she is continuing to have pain in the right heel.  She has been taking the meloxicam intermittently which does help alleviate some of her symptoms.  No new complaints at this time   Past Medical History:  Diagnosis Date  . Hypertension   . Seasonal allergies      Objective: Physical Exam General: The patient is alert and oriented x3 in no acute distress.  Dermatology: Skin is warm, dry and supple bilateral lower extremities. Negative for open lesions or macerations bilateral.   Vascular: Dorsalis Pedis and Posterior Tibial pulses palpable bilateral.  Capillary fill time is immediate to all digits.  Neurological: Epicritic and protective threshold intact bilateral.   Musculoskeletal: Tenderness to palpation to the plantar aspect of the right heel along the plantar fascia. All other joints range of motion within normal limits bilateral. Strength 5/5 in all groups bilateral.   Assessment: 1. plantar fasciitis right 2.  History of plantar fasciitis bilateral  Plan of Care:  1. Patient evaluated.   2. Injection of 0.5cc Celestone soluspan injected into the right heel  3.  Patient does have history of GI upset with NSAIDs.  Continue meloxicam as needed, and cautiously with food so does not cause GI upset 4.  Continue plantar fascial brace 5.  Return to clinic in 6 weeks  *Goes by Marcelino Duster.  Does COVID screening at [facility]   Felecia Shelling, DPM [facility]  Dr. Felecia Shelling, DPM    [address] [place], [place] [address]                Office [phone]  Fax [phone]

## 2020-12-09 ENCOUNTER — Ambulatory Visit (INDEPENDENT_AMBULATORY_CARE_PROVIDER_SITE_OTHER): Payer: Managed Care, Other (non HMO) | Admitting: Podiatry

## 2020-12-09 ENCOUNTER — Other Ambulatory Visit: Payer: Self-pay

## 2020-12-09 ENCOUNTER — Ambulatory Visit (INDEPENDENT_AMBULATORY_CARE_PROVIDER_SITE_OTHER): Payer: Managed Care, Other (non HMO)

## 2020-12-09 DIAGNOSIS — M722 Plantar fascial fibromatosis: Secondary | ICD-10-CM

## 2020-12-09 MED ORDER — MELOXICAM 15 MG PO TABS: 15.0000 mg | ORAL_TABLET | Freq: Every day | ORAL | 1 refills | Status: DC

## 2020-12-23 NOTE — Progress Notes (Signed)
   Subjective: 45 y.o. female presenting today for recurrence of right heel pain.  Patient states there is some improvement however she is continuing to have pain in the right heel.  She has been taking the meloxicam intermittently which does help alleviate some of her symptoms.  No new complaints at this time   Past Medical History:  Diagnosis Date  . Hypertension   . Seasonal allergies      Objective: Physical Exam General: The patient is alert and oriented x3 in no acute distress.  Dermatology: Skin is warm, dry and supple bilateral lower extremities. Negative for open lesions or macerations bilateral.   Vascular: Dorsalis Pedis and Posterior Tibial pulses palpable bilateral.  Capillary fill time is immediate to all digits.  Neurological: Epicritic and protective threshold intact bilateral.   Musculoskeletal: Tenderness to palpation to the plantar aspect of the right heel along the plantar fascia. All other joints range of motion within normal limits bilateral. Strength 5/5 in all groups bilateral.   Assessment: 1. plantar fasciitis right 2.  History of plantar fasciitis bilateral  Plan of Care:  1. Patient evaluated.   2. Today we discussed the conservative versus surgical management of the presenting pathology. The patient opts for surgical management. All possible complications and details of the procedure were explained. All patient questions were answered. No guarantees were expressed or implied. 3. Authorization for surgery was initiated today. Surgery will consist of endoscopic plantar fasciotomy right 4.  Refill prescription for meloxicam 15 mg daily 5.  Return to clinic 1 week postop  *Goes by Amy Durham.  Does COVID screening at [facility]   Felecia Shelling, DPM [facility]  Dr. Felecia Shelling, DPM    [address] [place], [place] [address]                Office [phone]  Fax [phone]

## 2021-01-02 ENCOUNTER — Telehealth: Payer: Self-pay | Admitting: Urology

## 2021-01-02 NOTE — Telephone Encounter (Signed)
DOS - 01/23/21  EPF RIGHT - [id number]  [employer] EFFECTIVE DATE 10/23/20  PLAN DEDUCTIBLE - $1,200.00 W/ $1,200.00 REMAINING OUT OF POCKET - $6,000.00 W/ 5,900.00 REMAINING COPAY - $0.00 COINSURANCE - 20%   PER [employer] AUTOMATIVE SYSTEM NO PRIOR AUTH IS REQUIRED FOR CPT CODE 11021.  REF# [id number] CONFIRMATION # [id number]

## 2021-01-13 ENCOUNTER — Telehealth: Payer: Self-pay

## 2021-01-13 NOTE — Telephone Encounter (Signed)
Toba called to cancel er surgery with Dr. Logan Bores on 01/23/2021. She stated she wants to hold off for right now. Notified Dr. Logan Bores and Aram Beecham with [facility].

## 2021-01-29 ENCOUNTER — Encounter: Payer: Managed Care, Other (non HMO) | Admitting: Podiatry

## 2021-02-05 ENCOUNTER — Encounter: Payer: Managed Care, Other (non HMO) | Admitting: Podiatry

## 2021-02-19 ENCOUNTER — Encounter: Payer: Managed Care, Other (non HMO) | Admitting: Podiatry

## 2021-05-02 ENCOUNTER — Ambulatory Visit
Admission: EM | Admit: 2021-05-02 | Discharge: 2021-05-02 | Disposition: A | Discharge status: Home / Self Care | Payer: Managed Care, Other (non HMO) | Attending: Emergency Medicine | Admitting: Emergency Medicine

## 2021-05-02 ENCOUNTER — Encounter: Payer: Self-pay | Admitting: Emergency Medicine

## 2021-05-02 ENCOUNTER — Other Ambulatory Visit: Payer: Self-pay

## 2021-05-02 DIAGNOSIS — J019 Acute sinusitis, unspecified: Secondary | ICD-10-CM

## 2021-05-02 MED ORDER — PREDNISONE 20 MG PO TABS: 20.0000 mg | ORAL_TABLET | Freq: Two times a day (BID) | ORAL | 0 refills | Status: AC

## 2021-05-02 MED ORDER — LEVOFLOXACIN 750 MG PO TABS: 750.0000 mg | ORAL_TABLET | Freq: Every day | ORAL | 0 refills | Status: AC

## 2021-05-02 NOTE — ED Triage Notes (Signed)
States she has a sinus infection over a week ago.  Has been on amoxicillin for 7 days.  Continues to have the same symptoms.  Several negative covid tests from work.

## 2021-05-02 NOTE — Discharge Instructions (Addendum)
Get plenty of rest and push fluids Levaquin and prednisone prescribed Use OTC zyrtec for nasal congestion, runny nose, and/or sore throat Use OTC flonase for nasal congestion and runny nose Use medications daily for symptom relief Use OTC medications like ibuprofen or tylenol as needed fever or pain Go to the ED if you have any new or worsening symptoms such as fever, worsening cough, shortness of breath, chest tightness, chest pain, turning blue, changes in mental status, symptoms do not improve with medications over the next 12-24 hours, etc..Marland Kitchen

## 2021-05-02 NOTE — ED Provider Notes (Signed)
[facility]   [id number] 05/02/21 Arrival Time: 1524   CC: Sinus congestion  SUBJECTIVE: History from: patient.  Amy Durham is a 45 y.o. female who presents with sinus congestion, pain, and pressure apx 2 weeks.   Denies sick exposure to COVID, flu or strep.  Recently finished a course of amoxicillin, and symptoms have persisted.  Denies previous symptoms in the past.   Denies fever, chills, SOB, wheezing, chest pain, nausea, changes in bowel or bladder habits.    ROS: As per HPI.  All other pertinent ROS negative.     Past Medical History:  Diagnosis Date   Hypertension    Seasonal allergies    Past Surgical History:  Procedure Laterality Date   BIOPSY  06/07/2015   Procedure: BIOPSY (GASTRIC);  Surgeon: West Bali, MD;  Location: [facility];  Service: Endoscopy;;   CHOLECYSTECTOMY N/A 05/22/2015   Procedure: LAPAROSCOPIC CHOLECYSTECTOMY;  Surgeon: Franky Macho, MD;  Location: [facility];  Service: General;  Laterality: N/A;   ERCP N/A 06/07/2015   Procedure: ENDOSCOPIC RETROGRADE CHOLANGIOPANCREATOGRAPHY (ERCP);  Surgeon: West Bali, MD;  Location: [facility];  Service: Endoscopy;  Laterality: N/A;   REMOVAL OF STONES N/A 06/07/2015   Procedure: REMOVAL OF COMMON BILE DUCT STONES (3);  Surgeon: West Bali, MD;  Location: [facility];  Service: Endoscopy;  Laterality: N/A;   SPHINCTEROTOMY N/A 06/07/2015   Procedure: SPHINCTEROTOMY;  Surgeon: West Bali, MD;  Location: [facility];  Service: Endoscopy;  Laterality: N/A;   No Known Allergies Current Facility-Administered Medications on File Prior to Encounter  Medication Dose Route Frequency Provider Last Rate Last Admin   betamethasone acetate-betamethasone sodium phosphate (CELESTONE) injection 3 mg  3 mg Intramuscular Once Gala Lewandowsky M, DPM       betamethasone acetate-betamethasone sodium phosphate (CELESTONE) injection 3 mg  3 mg Intra-articular Once Felecia Shelling, DPM       Current Outpatient Medications on File Prior to  Encounter  Medication Sig Dispense Refill   albuterol (VENTOLIN HFA) 108 (90 Base) MCG/ACT inhaler Inhale 1-2 puffs into the lungs every 6 (six) hours as needed for wheezing or shortness of breath. 18 g 0   benzonatate (TESSALON) 100 MG capsule Take 1 capsule (100 mg total) by mouth every 8 (eight) hours. 30 capsule 0   cetirizine (ZYRTEC ALLERGY) 10 MG tablet Take 1 tablet (10 mg total) by mouth daily. 30 tablet 0   cyclobenzaprine (FLEXERIL) 10 MG tablet Take 1 tablet (10 mg total) by mouth 2 (two) times daily as needed for muscle spasms. 20 tablet 0   desogestrel-ethinyl estradiol (APRI,EMOQUETTE,SOLIA) 0.15-30 MG-MCG tablet Take 1 tablet by mouth daily.     diclofenac (VOLTAREN) 75 MG EC tablet Take 1 tablet (75 mg total) by mouth 2 (two) times daily. 30 tablet 0   escitalopram (LEXAPRO) 10 MG tablet Take 10 mg by mouth daily.     fexofenadine (ALLEGRA) 180 MG tablet Take by mouth.     fluticasone (FLONASE) 50 MCG/ACT nasal spray Place 1 spray into both nostrils daily for 14 days. 16 g 0   ibuprofen (ADVIL,MOTRIN) 200 MG tablet Take 600 mg by mouth every 6 (six) hours as needed for headache.     Lactobacillus Rhamnosus, GG, (RA PROBIOTIC DIGESTIVE CARE) CAPS Take 1 capsule by mouth daily.     levonorgestrel (MIRENA) 20 MCG/24HR IUD by Intrauterine route.     lisinopril (ZESTRIL) 10 MG tablet Take 10 mg by mouth daily.  loratadine (CLARITIN) 10 MG tablet Take 10 mg by mouth daily.     meloxicam (MOBIC) 15 MG tablet Take 1 tablet (15 mg total) by mouth daily. 30 tablet 1   Omega-3 1000 MG CAPS Take by mouth.     valACYclovir (VALTREX) 500 MG tablet TAKE 1 TABLET BY MOUTH TWICE DAILY FOR 4 DAYS THEN AS NEEDED     Social History   Socioeconomic History   Marital status: Married    Spouse name: Not on file   Number of children: Not on file   Years of education: Not on file   Highest education level: Not on file  Occupational History   Not on file  Tobacco Use   Smoking status: Never    Smokeless tobacco: Never  Substance and Sexual Activity   Alcohol use: No   Drug use: No   Sexual activity: Yes    Birth control/protection: Pill  Other Topics Concern   Not on file  Social History Narrative   Not on file   Social Determinants of Health   Financial Resource Strain: Not on file  Food Insecurity: Not on file  Transportation Needs: Not on file  Physical Activity: Not on file  Stress: Not on file  Social Connections: Not on file  Intimate Partner Violence: Not on file   Family History  Problem Relation Age of Onset   Breast cancer Mother     OBJECTIVE:  Vitals:   05/02/21 1550  BP: 135/75  Pulse: 75  Resp: 16  Temp: 98.4 F (36.9 C)  TempSrc: Oral  SpO2: 98%     General appearance: alert; appears fatigued, but nontoxic; speaking in full sentences and tolerating own secretions HEENT: NCAT; Ears: EACs clear, TMs pearly gray; Eyes: PERRL.  EOM grossly intact. Nose: nares patent without rhinorrhea, Throat: oropharynx clear, tonsils non erythematous or enlarged, uvula midline  Neck: supple without LAD Lungs: unlabored respirations, symmetrical air entry; cough: mild; no respiratory distress; CTAB Heart: regular rate and rhythm.   Skin: warm and dry Psychological: alert and cooperative; normal mood and affect  ASSESSMENT & PLAN:  1. Acute non-recurrent sinusitis, unspecified location     Meds ordered this encounter  Medications   predniSONE (DELTASONE) 20 MG tablet    Sig: Take 1 tablet (20 mg total) by mouth 2 (two) times daily with a meal for 5 days.    Dispense:  10 tablet    Refill:  0    Order Specific Question:   Supervising Provider    Answer:   Eustace Moore [[id number]]   levofloxacin (LEVAQUIN) 750 MG tablet    Sig: Take 1 tablet (750 mg total) by mouth daily for 5 days.    Dispense:  5 tablet    Refill:  0    Order Specific Question:   Supervising Provider    Answer:   Eustace Moore [[id number]]    Get plenty of rest and  push fluids Levaquin and prednisone prescribed Use OTC zyrtec for nasal congestion, runny nose, and/or sore throat Use OTC flonase for nasal congestion and runny nose Use medications daily for symptom relief Use OTC medications like ibuprofen or tylenol as needed fever or pain Go to the ED if you have any new or worsening symptoms such as fever, worsening cough, shortness of breath, chest tightness, chest pain, turning blue, changes in mental status, symptoms do not improve with medications over the next 12-24 hours, etc...   Reviewed expectations re: course of current medical  issues. Questions answered. Outlined signs and symptoms indicating need for more acute intervention. Patient verbalized understanding. After Visit Summary given.          Rennis Harding, PA-C 05/02/21 1604

## 2021-06-25 ENCOUNTER — Other Ambulatory Visit (HOSPITAL_COMMUNITY): Payer: Self-pay

## 2021-06-26 ENCOUNTER — Other Ambulatory Visit (HOSPITAL_COMMUNITY): Payer: Self-pay

## 2021-06-26 MED ORDER — LISINOPRIL 10 MG PO TABS
10.0000 mg | ORAL_TABLET | Freq: Every day | ORAL | 0 refills | Status: DC
  Filled 2021-06-26: qty 30, 30d supply, fill #0

## 2021-06-26 MED ORDER — ESCITALOPRAM OXALATE 10 MG PO TABS
10.0000 mg | ORAL_TABLET | Freq: Every day | ORAL | 0 refills | Status: DC
  Filled 2021-06-26: qty 90, 90d supply, fill #0

## 2021-07-29 ENCOUNTER — Other Ambulatory Visit (HOSPITAL_COMMUNITY): Payer: Self-pay

## 2021-07-29 MED ORDER — LISINOPRIL 10 MG PO TABS
10.0000 mg | ORAL_TABLET | Freq: Every day | ORAL | 0 refills | Status: DC
  Filled 2021-07-29: qty 90, 90d supply, fill #0

## 2021-08-07 ENCOUNTER — Other Ambulatory Visit (HOSPITAL_COMMUNITY): Payer: Self-pay

## 2021-08-07 MED ORDER — LISINOPRIL 10 MG PO TABS
10.0000 mg | ORAL_TABLET | Freq: Every day | ORAL | 0 refills | Status: DC
  Filled 2021-08-07 – 2021-11-28 (×2): qty 90, 90d supply, fill #0

## 2021-08-12 ENCOUNTER — Telehealth: Payer: No Typology Code available for payment source | Admitting: Physician Assistant

## 2021-08-12 DIAGNOSIS — R300 Dysuria: Secondary | ICD-10-CM

## 2021-08-12 MED ORDER — CEPHALEXIN 500 MG PO CAPS: 500.0000 mg | ORAL_CAPSULE | Freq: Two times a day (BID) | ORAL | 0 refills | Status: AC

## 2021-08-12 NOTE — Progress Notes (Signed)

## 2021-08-12 NOTE — Progress Notes (Signed)
I have spent 5 minutes in review of e-visit questionnaire, review and updating patient chart, medical decision making and response to patient.   Marlon Suleiman Cody Ronon Ferger, PA-C    

## 2021-09-08 ENCOUNTER — Other Ambulatory Visit (HOSPITAL_COMMUNITY): Payer: Self-pay | Admitting: Internal Medicine

## 2021-09-08 DIAGNOSIS — Z1231 Encounter for screening mammogram for malignant neoplasm of breast: Secondary | ICD-10-CM

## 2021-09-15 ENCOUNTER — Other Ambulatory Visit: Payer: Self-pay

## 2021-09-15 ENCOUNTER — Ambulatory Visit (HOSPITAL_COMMUNITY)
Admission: RE | Admit: 2021-09-15 | Discharge: 2021-09-15 | Disposition: A | Discharge status: Home / Self Care | Payer: No Typology Code available for payment source | Source: Ambulatory Visit | Attending: Internal Medicine | Admitting: Internal Medicine

## 2021-09-15 DIAGNOSIS — Z1231 Encounter for screening mammogram for malignant neoplasm of breast: Secondary | ICD-10-CM | POA: Diagnosis not present

## 2021-11-12 ENCOUNTER — Other Ambulatory Visit (HOSPITAL_COMMUNITY): Payer: Self-pay

## 2021-11-12 MED ORDER — ESCITALOPRAM OXALATE 20 MG PO TABS
20.0000 mg | ORAL_TABLET | Freq: Every day | ORAL | 3 refills | Status: DC
  Filled 2021-11-12: qty 90, 90d supply, fill #0
  Filled 2022-03-03: qty 90, 90d supply, fill #1
  Filled 2022-07-16: qty 90, 90d supply, fill #2
  Filled 2022-10-22: qty 90, 90d supply, fill #3

## 2021-11-12 MED ORDER — VALACYCLOVIR HCL 1 G PO TABS
2000.0000 mg | ORAL_TABLET | Freq: Two times a day (BID) | ORAL | 2 refills | Status: DC
Start: 2021-11-12 — End: 2022-07-14
  Filled 2021-11-12: qty 4, 1d supply, fill #0
  Filled 2022-02-11: qty 4, 1d supply, fill #1
  Filled 2022-06-07: qty 4, 1d supply, fill #2

## 2021-11-12 MED ORDER — OZEMPIC (0.25 OR 0.5 MG/DOSE) 2 MG/1.5ML ~~LOC~~ SOPN
0.2500 mg | PEN_INJECTOR | SUBCUTANEOUS | 2 refills | Status: DC
  Filled 2021-11-12 – 2021-11-21 (×2): qty 1.5, 56d supply, fill #0

## 2021-11-21 ENCOUNTER — Other Ambulatory Visit (HOSPITAL_COMMUNITY): Payer: Self-pay

## 2021-11-21 MED ORDER — SEMAGLUTIDE-WEIGHT MANAGEMENT 0.25 MG/0.5ML ~~LOC~~ SOAJ
0.2500 mg | SUBCUTANEOUS | 0 refills | Status: DC
  Filled 2021-11-21 – 2021-12-18 (×5): qty 2, 28d supply, fill #0

## 2021-11-25 ENCOUNTER — Other Ambulatory Visit (HOSPITAL_COMMUNITY): Payer: Self-pay

## 2021-11-28 ENCOUNTER — Other Ambulatory Visit (HOSPITAL_COMMUNITY): Payer: Self-pay

## 2021-11-28 MED ORDER — ATORVASTATIN CALCIUM 10 MG PO TABS
10.0000 mg | ORAL_TABLET | Freq: Every day | ORAL | 2 refills | Status: DC
Start: 2021-11-28 — End: 2022-10-23
  Filled 2021-11-28: qty 90, 90d supply, fill #0
  Filled 2022-03-01: qty 90, 90d supply, fill #1
  Filled 2022-06-14: qty 90, 90d supply, fill #2

## 2021-11-28 MED ORDER — CONTRAVE 8-90 MG PO TB12
2.0000 | ORAL_TABLET | Freq: Two times a day (BID) | ORAL | 0 refills | Status: DC
  Filled 2021-12-03: qty 120, 30d supply, fill #0

## 2021-11-28 MED ORDER — CONTRAVE 8-90 MG PO TB12
ORAL_TABLET | ORAL | 0 refills | Status: AC
  Filled 2021-11-28 – 2021-12-03 (×3): qty 21, 37d supply, fill #0

## 2021-12-03 ENCOUNTER — Other Ambulatory Visit (HOSPITAL_COMMUNITY): Payer: Self-pay

## 2021-12-11 ENCOUNTER — Other Ambulatory Visit (HOSPITAL_COMMUNITY): Payer: Self-pay

## 2021-12-18 ENCOUNTER — Other Ambulatory Visit (HOSPITAL_COMMUNITY): Payer: Self-pay

## 2021-12-19 ENCOUNTER — Other Ambulatory Visit (HOSPITAL_COMMUNITY): Payer: Self-pay

## 2021-12-19 MED ORDER — WEGOVY 0.25 MG/0.5ML ~~LOC~~ SOAJ
0.2500 mg | SUBCUTANEOUS | 0 refills | Status: DC
  Filled 2021-12-19 – 2022-01-09 (×2): qty 2, 28d supply, fill #0

## 2021-12-31 ENCOUNTER — Other Ambulatory Visit (HOSPITAL_COMMUNITY): Payer: Self-pay

## 2022-01-12 ENCOUNTER — Other Ambulatory Visit (HOSPITAL_COMMUNITY): Payer: Self-pay

## 2022-01-22 ENCOUNTER — Other Ambulatory Visit (HOSPITAL_COMMUNITY): Payer: Self-pay

## 2022-01-22 MED ORDER — WEGOVY 0.5 MG/0.5ML ~~LOC~~ SOAJ
0.5000 mg | SUBCUTANEOUS | 1 refills | Status: DC
  Filled 2022-01-22: qty 2, 28d supply, fill #0
  Filled 2022-08-14 – 2022-08-28 (×2): qty 2, 28d supply, fill #1

## 2022-02-12 ENCOUNTER — Other Ambulatory Visit (HOSPITAL_COMMUNITY): Payer: Self-pay

## 2022-02-25 IMAGING — MG MM DIGITAL SCREENING BILAT W/ TOMO AND CAD
6 of 10 series · 6 of 30 positions shown · non-contrast
Comparison: Previous exam(s).

CLINICAL DATA: Screening.

EXAM:
DIGITAL SCREENING BILATERAL MAMMOGRAM WITH TOMOSYNTHESIS AND CAD
TECHNIQUE: Bilateral screening digital craniocaudal and mediolateral oblique
mammograms were obtained. Bilateral screening digital breast
tomosynthesis was performed. The images were evaluated with
computer-aided detection.

[R MLO synth-2D]
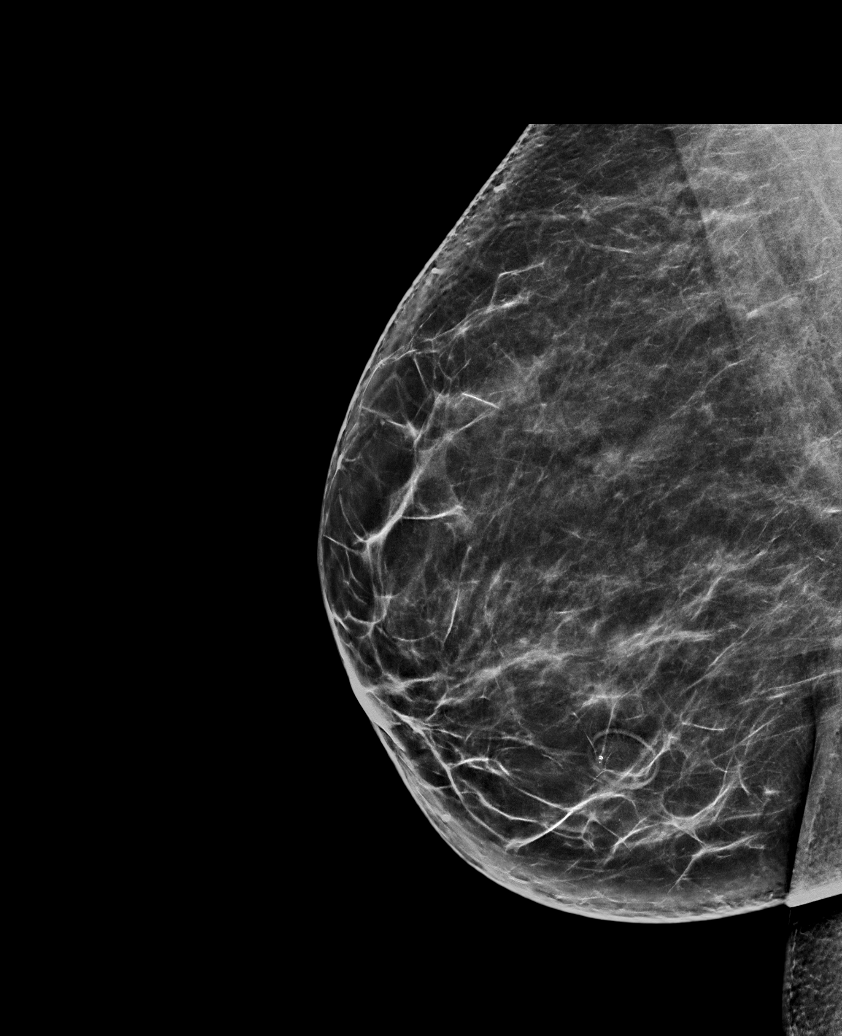

[L CC synth-2D]
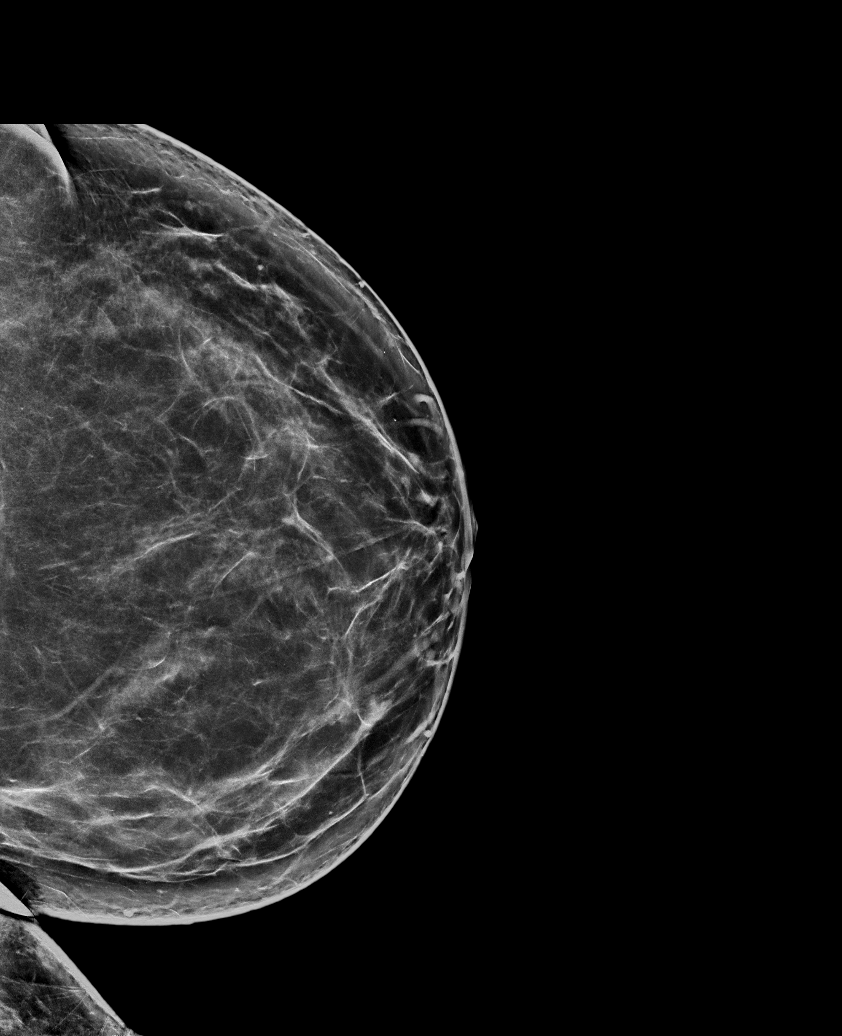

[L MLO synth-2D (1 of 2)]
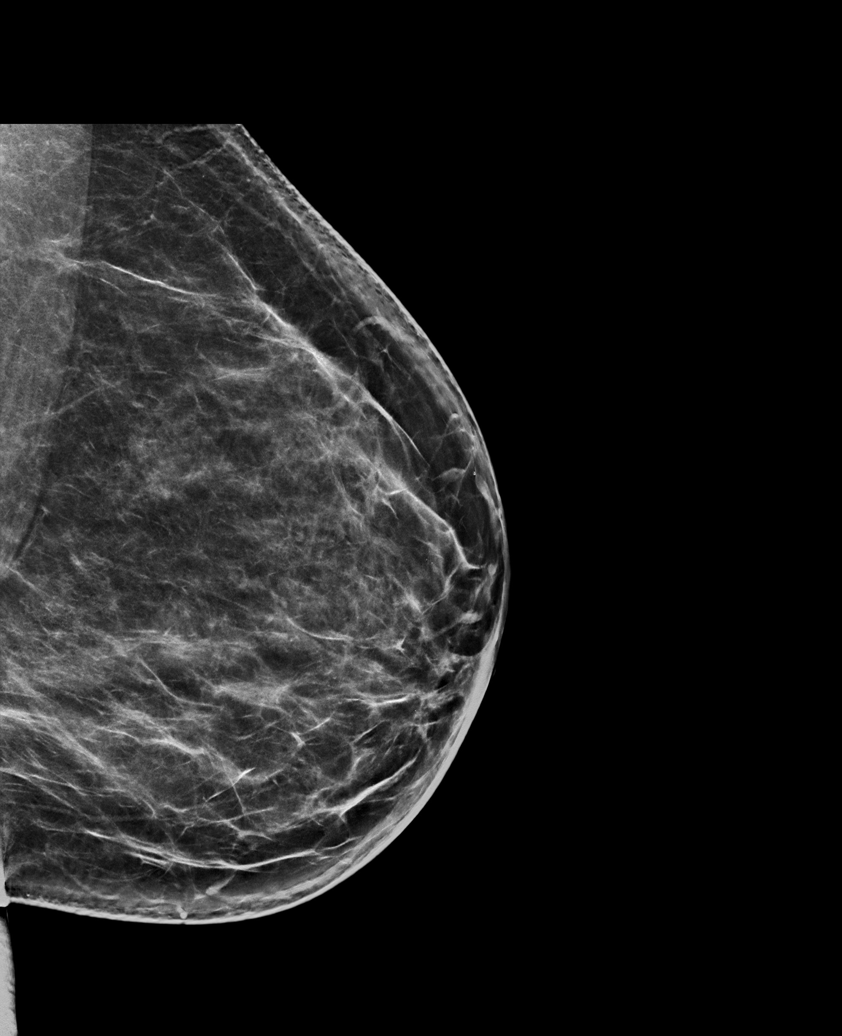

[R CC synth-2D]
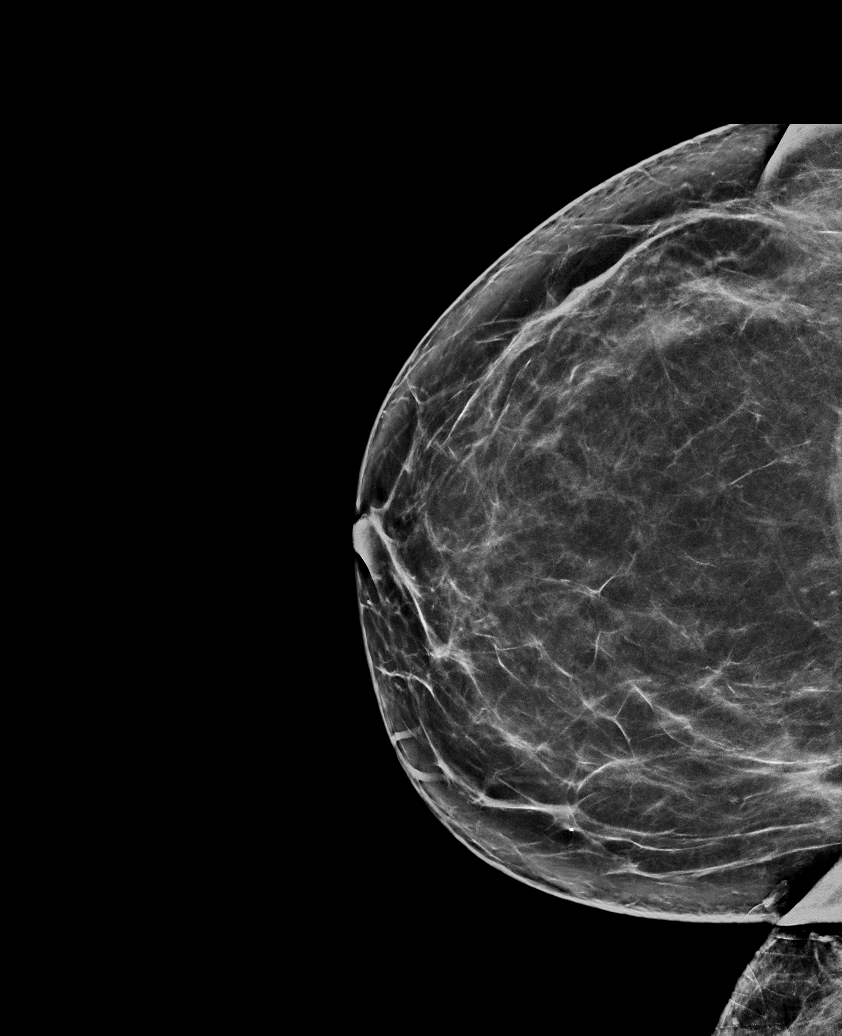

[L MLO synth-2D (2 of 2)]
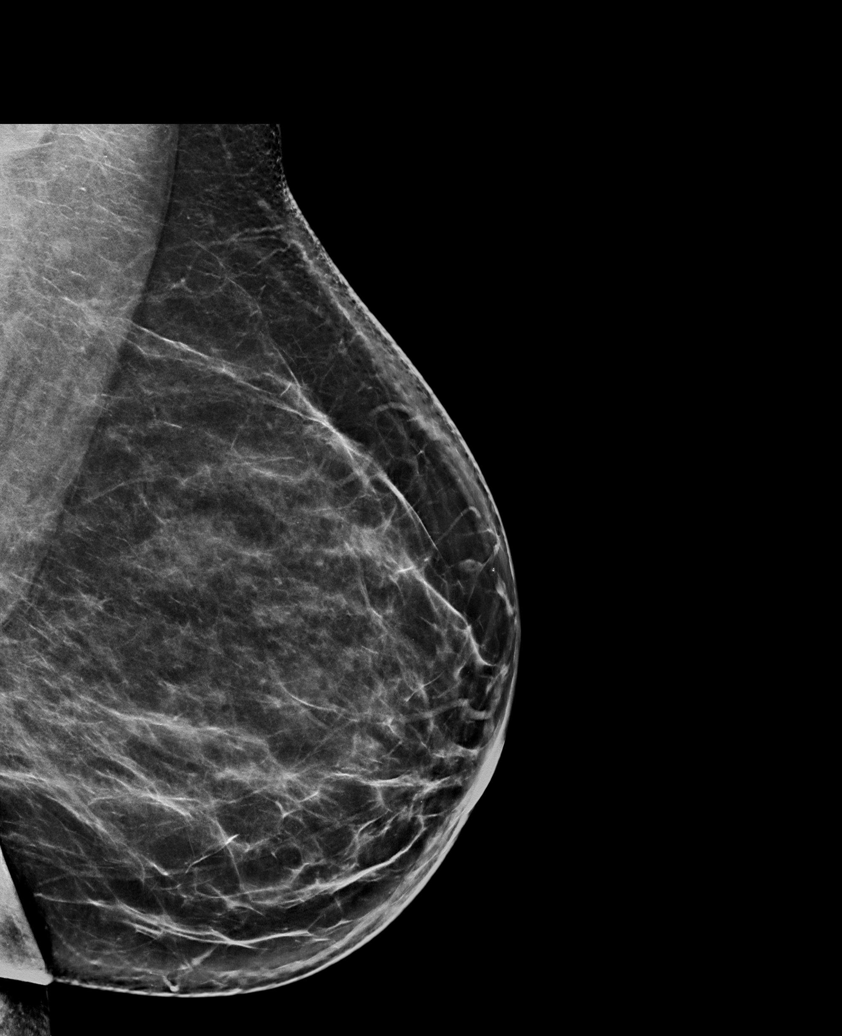

[L MLO tomo · tomo slice 45/90.0]
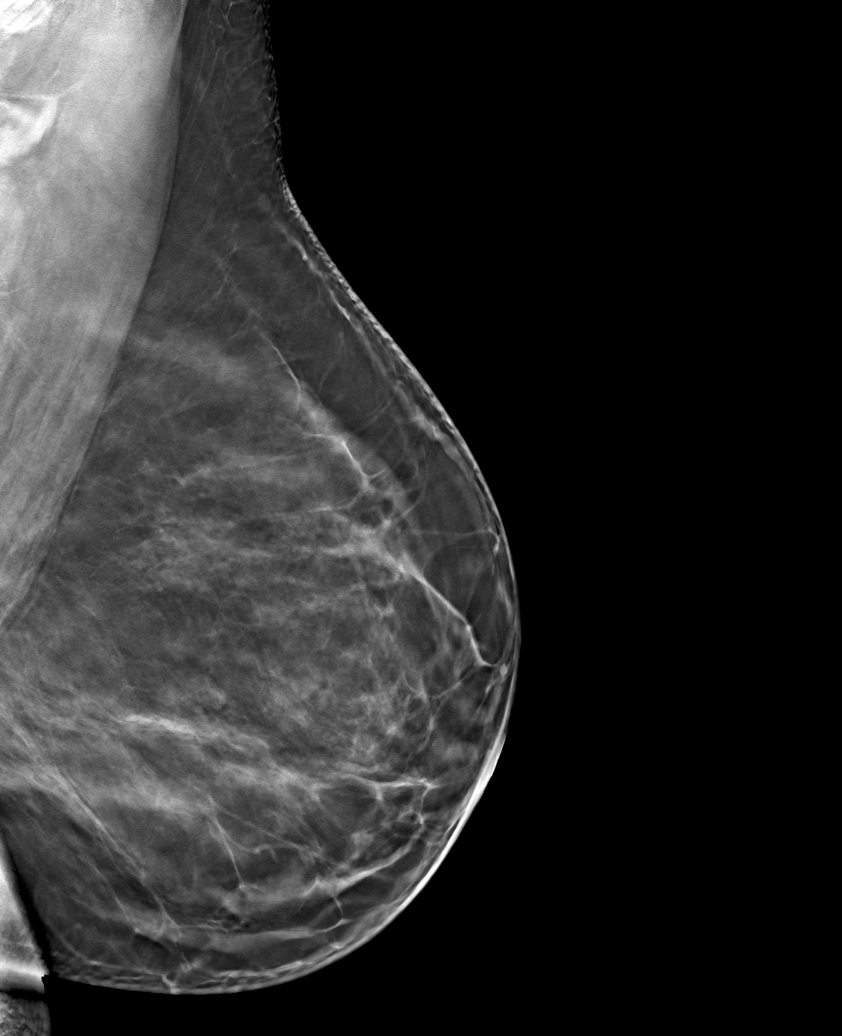

[6 of 30 positions shown; findings below may reference images not displayed]

ACR Breast Density Category b: There are scattered areas of
fibroglandular density.
FINDINGS: There are no findings suspicious for malignancy.
IMPRESSION: No mammographic evidence of malignancy. A result letter of this
screening mammogram will be mailed directly to the patient.

RECOMMENDATION:
Screening mammogram in one year. (Code:51-O-LD2)

BI-RADS CATEGORY  1: Negative.

## 2022-03-03 ENCOUNTER — Other Ambulatory Visit (HOSPITAL_COMMUNITY): Payer: Self-pay

## 2022-03-11 ENCOUNTER — Other Ambulatory Visit (HOSPITAL_COMMUNITY): Payer: Self-pay

## 2022-03-11 MED ORDER — WEGOVY 1 MG/0.5ML ~~LOC~~ SOAJ
1.0000 mg | SUBCUTANEOUS | 0 refills | Status: DC
  Filled 2022-03-11 – 2022-08-14 (×4): qty 2, 28d supply, fill #0

## 2022-03-26 ENCOUNTER — Other Ambulatory Visit (HOSPITAL_COMMUNITY): Payer: Self-pay

## 2022-03-27 ENCOUNTER — Other Ambulatory Visit (HOSPITAL_COMMUNITY): Payer: Self-pay

## 2022-03-27 MED ORDER — LISINOPRIL 10 MG PO TABS
10.0000 mg | ORAL_TABLET | Freq: Every day | ORAL | 0 refills | Status: DC
  Filled 2022-03-27: qty 90, 90d supply, fill #0

## 2022-05-25 ENCOUNTER — Other Ambulatory Visit (HOSPITAL_COMMUNITY): Payer: Self-pay

## 2022-06-09 ENCOUNTER — Other Ambulatory Visit (HOSPITAL_COMMUNITY): Payer: Self-pay

## 2022-06-15 ENCOUNTER — Other Ambulatory Visit (HOSPITAL_COMMUNITY): Payer: Self-pay

## 2022-07-13 ENCOUNTER — Other Ambulatory Visit (HOSPITAL_COMMUNITY): Payer: Self-pay

## 2022-07-13 ENCOUNTER — Telehealth: Payer: No Typology Code available for payment source | Admitting: Physician Assistant

## 2022-07-13 DIAGNOSIS — J069 Acute upper respiratory infection, unspecified: Secondary | ICD-10-CM

## 2022-07-13 MED ORDER — BENZONATATE 100 MG PO CAPS: 100.0000 mg | ORAL_CAPSULE | Freq: Three times a day (TID) | ORAL | 0 refills | Status: DC | PRN

## 2022-07-13 NOTE — Progress Notes (Signed)
I have spent 5 minutes in review of e-visit questionnaire, review and updating patient chart, medical decision making and response to patient.   Fransheska Willingham Cody Avaiah Stempel, PA-C    

## 2022-07-13 NOTE — Progress Notes (Signed)

## 2022-07-14 ENCOUNTER — Other Ambulatory Visit (HOSPITAL_COMMUNITY): Payer: Self-pay

## 2022-07-14 MED ORDER — VALACYCLOVIR HCL 1 G PO TABS
2000.0000 mg | ORAL_TABLET | Freq: Two times a day (BID) | ORAL | 1 refills | Status: DC
  Filled 2022-07-14: qty 20, 5d supply, fill #0
  Filled 2022-10-22 – 2022-11-24 (×2): qty 20, 5d supply, fill #1

## 2022-07-16 ENCOUNTER — Other Ambulatory Visit (HOSPITAL_COMMUNITY): Payer: Self-pay

## 2022-08-14 ENCOUNTER — Other Ambulatory Visit (HOSPITAL_COMMUNITY): Payer: Self-pay

## 2022-08-15 ENCOUNTER — Telehealth: Payer: No Typology Code available for payment source | Admitting: Nurse Practitioner

## 2022-08-15 DIAGNOSIS — R399 Unspecified symptoms and signs involving the genitourinary system: Secondary | ICD-10-CM

## 2022-08-15 MED ORDER — CEPHALEXIN 500 MG PO CAPS: 500.0000 mg | ORAL_CAPSULE | Freq: Two times a day (BID) | ORAL | 0 refills | Status: AC

## 2022-08-15 NOTE — Progress Notes (Signed)
I have spent 5 minutes in review of e-visit questionnaire, review and updating patient chart, medical decision making and response to patient.  ° °Amar Keenum W Deysha Cartier, NP ° °  °

## 2022-08-15 NOTE — Progress Notes (Signed)

## 2022-08-17 ENCOUNTER — Other Ambulatory Visit (HOSPITAL_COMMUNITY): Payer: Self-pay

## 2022-08-17 MED ORDER — LISINOPRIL 10 MG PO TABS
10.0000 mg | ORAL_TABLET | Freq: Every day | ORAL | 0 refills | Status: DC
  Filled 2022-08-17: qty 90, 90d supply, fill #0

## 2022-08-18 ENCOUNTER — Other Ambulatory Visit (HOSPITAL_COMMUNITY): Payer: Self-pay

## 2022-08-20 ENCOUNTER — Other Ambulatory Visit (HOSPITAL_COMMUNITY): Payer: Self-pay | Admitting: Internal Medicine

## 2022-08-20 DIAGNOSIS — Z1231 Encounter for screening mammogram for malignant neoplasm of breast: Secondary | ICD-10-CM

## 2022-08-28 ENCOUNTER — Other Ambulatory Visit (HOSPITAL_COMMUNITY): Payer: Self-pay

## 2022-09-08 ENCOUNTER — Other Ambulatory Visit (HOSPITAL_COMMUNITY): Payer: Self-pay

## 2022-09-16 ENCOUNTER — Other Ambulatory Visit (HOSPITAL_COMMUNITY): Payer: Self-pay

## 2022-09-17 ENCOUNTER — Ambulatory Visit (HOSPITAL_COMMUNITY)
Admission: RE | Admit: 2022-09-17 | Discharge: 2022-09-17 | Disposition: A | Discharge status: Home / Self Care | Payer: No Typology Code available for payment source | Source: Ambulatory Visit | Attending: Internal Medicine | Admitting: Internal Medicine

## 2022-09-17 DIAGNOSIS — Z1231 Encounter for screening mammogram for malignant neoplasm of breast: Secondary | ICD-10-CM | POA: Insufficient documentation

## 2022-10-21 DIAGNOSIS — R7303 Prediabetes: Secondary | ICD-10-CM | POA: Diagnosis not present

## 2022-10-21 DIAGNOSIS — J329 Chronic sinusitis, unspecified: Secondary | ICD-10-CM | POA: Insufficient documentation

## 2022-10-21 DIAGNOSIS — R0683 Snoring: Secondary | ICD-10-CM | POA: Diagnosis not present

## 2022-10-21 DIAGNOSIS — E785 Hyperlipidemia, unspecified: Secondary | ICD-10-CM | POA: Diagnosis not present

## 2022-10-22 ENCOUNTER — Other Ambulatory Visit (HOSPITAL_COMMUNITY): Payer: Self-pay

## 2022-10-23 ENCOUNTER — Other Ambulatory Visit: Payer: Self-pay

## 2022-10-23 ENCOUNTER — Other Ambulatory Visit (HOSPITAL_COMMUNITY): Payer: Self-pay

## 2022-10-23 MED ORDER — WEGOVY 0.25 MG/0.5ML ~~LOC~~ SOAJ
0.2500 mg | SUBCUTANEOUS | 0 refills | Status: DC
  Filled 2022-10-23 – 2022-12-01 (×4): qty 2, 28d supply, fill #0

## 2022-10-23 MED ORDER — LISINOPRIL 10 MG PO TABS
10.0000 mg | ORAL_TABLET | Freq: Every day | ORAL | 0 refills | Status: DC
Start: 2022-10-23 — End: 2023-01-14
  Filled 2022-10-23: qty 90, 90d supply, fill #0

## 2022-10-23 MED ORDER — WEGOVY 0.25 MG/0.5ML ~~LOC~~ SOAJ
0.2500 mg | SUBCUTANEOUS | 0 refills | Status: DC
  Filled 2022-10-23: qty 2, 28d supply, fill #0

## 2022-10-23 MED ORDER — ATORVASTATIN CALCIUM 10 MG PO TABS
10.0000 mg | ORAL_TABLET | Freq: Every day | ORAL | 2 refills | Status: DC
  Filled 2022-10-23: qty 90, 90d supply, fill #0
  Filled 2023-01-21: qty 90, 90d supply, fill #1
  Filled 2023-04-27: qty 90, 90d supply, fill #2

## 2022-10-23 MED ORDER — LISINOPRIL 10 MG PO TABS
10.0000 mg | ORAL_TABLET | Freq: Every day | ORAL | 0 refills | Status: DC
  Filled 2022-10-23 – 2022-11-24 (×2): qty 90, 90d supply, fill #0

## 2022-10-23 MED ORDER — ESCITALOPRAM OXALATE 20 MG PO TABS
20.0000 mg | ORAL_TABLET | Freq: Every day | ORAL | 3 refills | Status: DC
  Filled 2022-10-23 – 2023-01-21 (×2): qty 90, 90d supply, fill #0
  Filled 2023-04-27: qty 90, 90d supply, fill #1
  Filled 2023-08-21: qty 90, 90d supply, fill #2

## 2022-10-26 ENCOUNTER — Other Ambulatory Visit: Payer: Self-pay

## 2022-10-29 ENCOUNTER — Other Ambulatory Visit (HOSPITAL_COMMUNITY): Payer: Self-pay

## 2022-10-29 MED ORDER — ATORVASTATIN CALCIUM 10 MG PO TABS
10.0000 mg | ORAL_TABLET | Freq: Every day | ORAL | 2 refills | Status: DC
  Filled 2022-10-29: qty 90, 90d supply, fill #0

## 2022-11-05 ENCOUNTER — Other Ambulatory Visit (HOSPITAL_COMMUNITY): Payer: Self-pay

## 2022-11-09 ENCOUNTER — Other Ambulatory Visit: Payer: Self-pay

## 2022-11-24 ENCOUNTER — Other Ambulatory Visit (HOSPITAL_COMMUNITY): Payer: Self-pay

## 2022-11-25 ENCOUNTER — Other Ambulatory Visit (HOSPITAL_COMMUNITY): Payer: Self-pay

## 2022-11-25 ENCOUNTER — Other Ambulatory Visit: Payer: Self-pay

## 2022-11-26 ENCOUNTER — Encounter (HOSPITAL_COMMUNITY): Payer: Self-pay

## 2022-11-26 ENCOUNTER — Other Ambulatory Visit (HOSPITAL_COMMUNITY): Payer: Self-pay

## 2022-11-26 ENCOUNTER — Other Ambulatory Visit: Payer: Self-pay

## 2022-12-02 ENCOUNTER — Other Ambulatory Visit (HOSPITAL_COMMUNITY): Payer: Self-pay

## 2022-12-02 ENCOUNTER — Other Ambulatory Visit: Payer: Self-pay

## 2022-12-07 ENCOUNTER — Other Ambulatory Visit: Payer: Self-pay

## 2022-12-09 ENCOUNTER — Other Ambulatory Visit (HOSPITAL_COMMUNITY): Payer: Self-pay

## 2022-12-10 ENCOUNTER — Other Ambulatory Visit (HOSPITAL_COMMUNITY): Payer: Self-pay

## 2023-01-13 ENCOUNTER — Other Ambulatory Visit (HOSPITAL_COMMUNITY): Payer: Self-pay

## 2023-01-13 MED ORDER — WEGOVY 1.7 MG/0.75ML ~~LOC~~ SOAJ
1.7000 mg | SUBCUTANEOUS | 0 refills | Status: DC
  Filled 2023-01-13 – 2023-01-19 (×2): qty 3, 28d supply, fill #0

## 2023-01-13 MED ORDER — WEGOVY 2.4 MG/0.75ML ~~LOC~~ SOAJ
2.4000 mg | SUBCUTANEOUS | 0 refills | Status: DC
  Filled 2023-01-13 – 2023-01-29 (×5): qty 3, 28d supply, fill #0

## 2023-01-14 ENCOUNTER — Other Ambulatory Visit (HOSPITAL_COMMUNITY)
Admission: RE | Admit: 2023-01-14 | Discharge: 2023-01-14 | Disposition: A | Discharge status: Home / Self Care | Payer: 59 | Source: Ambulatory Visit | Attending: Family Medicine | Admitting: Family Medicine

## 2023-01-14 ENCOUNTER — Ambulatory Visit (INDEPENDENT_AMBULATORY_CARE_PROVIDER_SITE_OTHER): Payer: 59 | Admitting: Family Medicine

## 2023-01-14 ENCOUNTER — Encounter: Payer: Self-pay | Admitting: Family Medicine

## 2023-01-14 ENCOUNTER — Other Ambulatory Visit: Payer: Self-pay

## 2023-01-14 VITALS — BP 109/70 | HR 84 | Ht 68.0 in | Wt 225.4 lb

## 2023-01-14 DIAGNOSIS — Z01411 Encounter for gynecological examination (general) (routine) with abnormal findings: Secondary | ICD-10-CM

## 2023-01-14 DIAGNOSIS — Z124 Encounter for screening for malignant neoplasm of cervix: Secondary | ICD-10-CM | POA: Diagnosis not present

## 2023-01-14 DIAGNOSIS — Z30433 Encounter for removal and reinsertion of intrauterine contraceptive device: Secondary | ICD-10-CM

## 2023-01-14 DIAGNOSIS — E7800 Pure hypercholesterolemia, unspecified: Secondary | ICD-10-CM | POA: Insufficient documentation

## 2023-01-14 DIAGNOSIS — I10 Essential (primary) hypertension: Secondary | ICD-10-CM | POA: Insufficient documentation

## 2023-01-14 MED ORDER — LEVONORGESTREL 20 MCG/DAY IU IUD
1.0000 | INTRAUTERINE_SYSTEM | Freq: Once | INTRAUTERINE | Status: AC
Start: 2023-01-14 — End: 2023-01-14
  Administered 2023-01-14: 1 via INTRAUTERINE

## 2023-01-14 NOTE — Progress Notes (Signed)
Subjective:     Amy Durham is a 47 y.o. female and is here for a comprehensive physical exam. The patient reports no problems.   The following portions of the patient's history were reviewed and updated as appropriate: allergies, current medications, past family history, past medical history, past social history, past surgical history, and problem list.  Review of Systems Pertinent items noted in HPI and remainder of comprehensive ROS otherwise negative.   Objective:    BP 109/70   Pulse 84   Ht 5\' 8"  (1.727 m)   Wt 225 lb 6.4 oz (102.2 kg)   BMI 34.27 kg/m  General appearance: alert, cooperative, and appears stated age Head: Normocephalic, without obvious abnormality, atraumatic Neck: no adenopathy, supple, symmetrical, trachea midline, and thyroid not enlarged, symmetric, no tenderness/mass/nodules Lungs: clear to auscultation bilaterally Breasts: normal appearance, no masses or tenderness Heart: regular rate and rhythm, S1, S2 normal, no murmur, click, rub or gallop Abdomen: soft, non-tender; bowel sounds normal; no masses,  no organomegaly Pelvic: cervix normal in appearance, external genitalia normal, no cervical motion tenderness, vagina normal without discharge, and IUD strings are not seen Extremities: Homans sign is negative, no sign of DVT Pulses: 2+ and symmetric Skin: Skin color, texture, turgor normal. No rashes or lesions Lymph nodes: Cervical, supraclavicular, and axillary nodes normal. Neurologic: Grossly normal    Procedure: Speculum placed inside vagina.  Cervix visualized.  Strings grasped with uterine dressing forceps.  IUD removed intact.  Procedure: Cleaned with Betadine x 2.  Grasped anteriourly with a single tooth tenaculum.  Uterus sounded to 8 cm.  Mirena IUD placed per manufacturer's recommendations.  Strings trimmed to 3 cm.   Patient given post procedure instructions and Mirena care card with expiration date.   Assessment:    Healthy female  exam.      Plan:  Encounter for gynecological examination with abnormal finding - Has had mammogram  Screening for malignant neoplasm of cervix - Plan: Cytology - PAP( [place])  Encounter for removal and reinsertion of intrauterine contraceptive device (IUD) - Patient is asked to check IUD strings periodically and follow up in 4-6 weeks for IUD check. - Plan: levonorgestrel (MIRENA) 20 MCG/DAY IUD 1 each    See After Visit Summary for Counseling Recommendations

## 2023-01-19 ENCOUNTER — Encounter: Payer: Self-pay | Admitting: Family Medicine

## 2023-01-19 ENCOUNTER — Other Ambulatory Visit (HOSPITAL_COMMUNITY): Payer: Self-pay

## 2023-01-19 LAB — CYTOLOGY - PAP
Chlamydia: NEGATIVE
Comment: NEGATIVE
Comment: NEGATIVE
Comment: NORMAL
Diagnosis: NEGATIVE
High risk HPV: NEGATIVE
Neisseria Gonorrhea: NEGATIVE

## 2023-01-20 ENCOUNTER — Other Ambulatory Visit: Payer: Self-pay

## 2023-01-20 ENCOUNTER — Other Ambulatory Visit (HOSPITAL_COMMUNITY): Payer: Self-pay

## 2023-01-21 ENCOUNTER — Other Ambulatory Visit: Payer: Self-pay

## 2023-01-21 ENCOUNTER — Other Ambulatory Visit (HOSPITAL_COMMUNITY): Payer: Self-pay

## 2023-01-25 ENCOUNTER — Other Ambulatory Visit: Payer: Self-pay

## 2023-01-29 ENCOUNTER — Other Ambulatory Visit: Payer: Self-pay

## 2023-01-30 ENCOUNTER — Other Ambulatory Visit (HOSPITAL_COMMUNITY): Payer: Self-pay

## 2023-03-17 ENCOUNTER — Other Ambulatory Visit (HOSPITAL_COMMUNITY): Payer: Self-pay

## 2023-03-17 DIAGNOSIS — E669 Obesity, unspecified: Secondary | ICD-10-CM | POA: Diagnosis not present

## 2023-03-17 DIAGNOSIS — Z6835 Body mass index (BMI) 35.0-35.9, adult: Secondary | ICD-10-CM | POA: Diagnosis not present

## 2023-03-17 DIAGNOSIS — Z7182 Exercise counseling: Secondary | ICD-10-CM | POA: Diagnosis not present

## 2023-03-17 DIAGNOSIS — Z713 Dietary counseling and surveillance: Secondary | ICD-10-CM | POA: Diagnosis not present

## 2023-03-17 MED ORDER — PHENTERMINE HCL 37.5 MG PO TABS
37.5000 mg | ORAL_TABLET | Freq: Every day | ORAL | 0 refills | Status: DC
  Filled 2023-03-17: qty 30, 30d supply, fill #0

## 2023-03-22 ENCOUNTER — Other Ambulatory Visit (HOSPITAL_COMMUNITY): Payer: Self-pay

## 2023-03-23 ENCOUNTER — Other Ambulatory Visit (HOSPITAL_COMMUNITY): Payer: Self-pay

## 2023-03-24 ENCOUNTER — Other Ambulatory Visit: Payer: Self-pay

## 2023-03-24 ENCOUNTER — Other Ambulatory Visit (HOSPITAL_COMMUNITY): Payer: Self-pay

## 2023-03-24 MED ORDER — PHENTERMINE HCL 37.5 MG PO TABS
37.5000 mg | ORAL_TABLET | Freq: Every day | ORAL | 0 refills | Status: DC
  Filled 2023-03-24: qty 30, 30d supply, fill #0

## 2023-04-19 ENCOUNTER — Other Ambulatory Visit: Payer: Self-pay | Admitting: Oncology

## 2023-04-19 ENCOUNTER — Other Ambulatory Visit (HOSPITAL_COMMUNITY): Admission: RE | Admit: 2023-04-19 | Payer: 59 | Source: Ambulatory Visit | Admitting: Oncology

## 2023-04-19 DIAGNOSIS — Z006 Encounter for examination for normal comparison and control in clinical research program: Secondary | ICD-10-CM

## 2023-04-21 DIAGNOSIS — Z1211 Encounter for screening for malignant neoplasm of colon: Secondary | ICD-10-CM | POA: Diagnosis not present

## 2023-04-21 DIAGNOSIS — R7303 Prediabetes: Secondary | ICD-10-CM | POA: Diagnosis not present

## 2023-04-21 DIAGNOSIS — E785 Hyperlipidemia, unspecified: Secondary | ICD-10-CM | POA: Diagnosis not present

## 2023-04-23 ENCOUNTER — Other Ambulatory Visit (HOSPITAL_COMMUNITY)
Admission: RE | Admit: 2023-04-23 | Discharge: 2023-04-23 | Disposition: A | Discharge status: Home / Self Care | Payer: 59 | Source: Ambulatory Visit | Attending: Oncology | Admitting: Oncology

## 2023-04-23 DIAGNOSIS — Z006 Encounter for examination for normal comparison and control in clinical research program: Secondary | ICD-10-CM

## 2023-04-27 ENCOUNTER — Other Ambulatory Visit (HOSPITAL_COMMUNITY): Payer: Self-pay

## 2023-04-27 ENCOUNTER — Other Ambulatory Visit: Payer: Self-pay

## 2023-04-27 MED ORDER — PHENTERMINE HCL 37.5 MG PO TABS
37.5000 mg | ORAL_TABLET | Freq: Every day | ORAL | 0 refills | Status: DC
  Filled 2023-04-27: qty 30, 30d supply, fill #0

## 2023-04-27 MED ORDER — LISINOPRIL 10 MG PO TABS
10.0000 mg | ORAL_TABLET | Freq: Every day | ORAL | 1 refills | Status: DC
  Filled 2023-04-27: qty 90, 90d supply, fill #0
  Filled 2023-08-21: qty 90, 90d supply, fill #1

## 2023-05-22 ENCOUNTER — Ambulatory Visit
Admission: EM | Admit: 2023-05-22 | Discharge: 2023-05-22 | Disposition: A | Discharge status: Home / Self Care | Payer: 59 | Attending: Nurse Practitioner | Admitting: Nurse Practitioner

## 2023-05-22 DIAGNOSIS — H9201 Otalgia, right ear: Secondary | ICD-10-CM | POA: Diagnosis not present

## 2023-05-22 DIAGNOSIS — J019 Acute sinusitis, unspecified: Secondary | ICD-10-CM | POA: Diagnosis not present

## 2023-05-22 MED ORDER — AMOXICILLIN-POT CLAVULANATE 875-125 MG PO TABS: 1.0000 | ORAL_TABLET | Freq: Two times a day (BID) | ORAL | 0 refills | Status: DC

## 2023-05-22 NOTE — Discharge Instructions (Addendum)
Take medication as directed. Continue your current allergy medication regimen. Increase fluids and get plenty of rest. May take over-the-counter ibuprofen or Tylenol as needed for pain, fever, or general discomfort. Recommend normal saline nasal spray to help with nasal congestion throughout the day. For your cough, it may be helpful to use a humidifier at bedtime during sleep.  For your ear: Warm compresses to the affected ear while symptoms persist. Do not stick or insert anything inside of the ear while symptoms are present. Avoid entrance of water inside of the ear while symptoms persist.  As discussed, if symptoms are not improving with this treatment, please follow-up with your primary care physician or with ENT for further evaluation. Follow-up as needed.

## 2023-05-22 NOTE — ED Provider Notes (Signed)
[facility]    CSN: [id number] Arrival date & time: 05/22/23  4098      History   Chief Complaint No chief complaint on file.   HPI Amy Durham is a 47 y.o. female.   The history is provided by the patient.   Patient reports for complaints of sinus issues and right ear pain.  Patient states sinus symptoms have been present for the past 3 weeks, including nasal congestion, headache, postnasal drip, and cough.  She states right ear pain started approximately 3 days ago.  She states that the pain comes and goes, and describes it as sharp.  She denies fever, chills, ear drainage, decreased hearing, dizziness, wheezing, shortness of breath, difficulty breathing, abdominal pain, nausea, vomiting, or diarrhea.  Patient reports that she does see ENT, she did call to schedule an appointment, but never heard back.  Patient has a history of seasonal allergies.  Reports that she has been using Synex nasal spray, Afrin, and Tylenol for symptoms.  Past Medical History:  Diagnosis Date   Hypertension    Seasonal allergies     Patient Active Problem List   Diagnosis Date Noted   Hypertension, benign essential, goal below 140/90 01/14/2023   Hypercholesteremia 01/14/2023   Recurrent sinusitis 10/21/2022   Snoring 10/21/2022   Common bile duct stone     Past Surgical History:  Procedure Laterality Date   BIOPSY  06/07/2015   Procedure: BIOPSY (GASTRIC);  Surgeon: West Bali, MD;  Location: [facility];  Service: Endoscopy;;   CHOLECYSTECTOMY N/A 05/22/2015   Procedure: LAPAROSCOPIC CHOLECYSTECTOMY;  Surgeon: Franky Macho, MD;  Location: [facility];  Service: General;  Laterality: N/A;   ERCP N/A 06/07/2015   Procedure: ENDOSCOPIC RETROGRADE CHOLANGIOPANCREATOGRAPHY (ERCP);  Surgeon: West Bali, MD;  Location: [facility];  Service: Endoscopy;  Laterality: N/A;   REMOVAL OF STONES N/A 06/07/2015   Procedure: REMOVAL OF COMMON BILE DUCT STONES (3);  Surgeon: West Bali, MD;  Location: [facility];  Service: Endoscopy;  Laterality: N/A;   SPHINCTEROTOMY N/A 06/07/2015   Procedure: SPHINCTEROTOMY;  Surgeon: West Bali, MD;  Location: [facility];  Service: Endoscopy;  Laterality: N/A;    OB History     Gravida  3   Para  3   Term  2   Preterm  1   AB      Living  3      SAB      IAB      Ectopic      Multiple      Live Births  3            Home Medications    Prior to Admission medications   Medication Sig Start Date End Date Taking? Authorizing Provider  amoxicillin-clavulanate (AUGMENTIN) 875-125 MG tablet Take 1 tablet by mouth every 12 (twelve) hours. 05/22/23  Yes Shuaib Corsino-Warren, Sadie Haber, NP  albuterol (VENTOLIN HFA) 108 (90 Base) MCG/ACT inhaler Inhale 1-2 puffs into the lungs every 6 (six) hours as needed for wheezing or shortness of breath. 06/14/20   Avegno, Zachery Dakins, FNP  atorvastatin (LIPITOR) 10 MG tablet Take 1 tablet (10 mg total) by mouth at bedtime. 10/23/22     cetirizine (ZYRTEC ALLERGY) 10 MG tablet Take 1 tablet (10 mg total) by mouth daily. 06/02/20   Avegno, Zachery Dakins, FNP  cyclobenzaprine (FLEXERIL) 10 MG tablet Take 1 tablet (10 mg total) by mouth 2 (two) times daily as needed for muscle spasms. 01/06/20  Fawze, Mina A, PA-C  diclofenac (VOLTAREN) 75 MG EC tablet Take 1 tablet (75 mg total) by mouth 2 (two) times daily. 01/06/20   Fawze, Mina A, PA-C  escitalopram (LEXAPRO) 20 MG tablet Take 1 tablet (20 mg total) by mouth daily. 10/23/22   Lupita Raider, NP  fexofenadine (ALLEGRA) 180 MG tablet Take by mouth.    [provider]  fluticasone (FLONASE) 50 MCG/ACT nasal spray Place 1 spray into both nostrils daily for 14 days. 06/02/20 06/16/20  Avegno, Zachery Dakins, FNP  ibuprofen (ADVIL,MOTRIN) 200 MG tablet Take 600 mg by mouth every 6 (six) hours as needed for headache.    [provider]  Lactobacillus Rhamnosus, GG, (RA PROBIOTIC DIGESTIVE CARE) CAPS Take 1 capsule by mouth daily.    [provider]  lisinopril  (ZESTRIL) 10 MG tablet Take 1 tablet (10 mg total) by mouth daily. 04/27/23     loratadine (CLARITIN) 10 MG tablet Take 10 mg by mouth daily.    [provider]  Omega-3 1000 MG CAPS Take by mouth.    [provider]  phentermine (ADIPEX-P) 37.5 MG tablet Take 1 tablet (37.5 mg total) by mouth daily. 04/27/23     Semaglutide-Weight Management (WEGOVY) 0.5 MG/0.5ML SOAJ Inject 0.5 mg into the skin once a week. 01/22/22     Semaglutide-Weight Management (WEGOVY) 1 MG/0.5ML SOAJ Inject 1 mg into the skin once a week. Patient not taking: Reported on 01/14/2023 03/11/22     valACYclovir (VALTREX) 1000 MG tablet Take 2 tablets (2,000 mg total) by mouth every 12 (twelve) hours for 1 day 07/14/22       Family History Family History  Problem Relation Age of Onset   Breast cancer Mother 14    Social History Social History   Tobacco Use   Smoking status: Never   Smokeless tobacco: Never  Substance Use Topics   Alcohol use: No   Drug use: No     Allergies   Patient has no active allergies.   Review of Systems Review of Systems Per HPI  Physical Exam Triage Vital Signs ED Triage Vitals  Encounter Vitals Group     BP 05/22/23 0908 123/77     Systolic BP Percentile --      Diastolic BP Percentile --      Pulse Rate 05/22/23 0908 87     Resp 05/22/23 0908 18     Temp 05/22/23 0908 98.2 F (36.8 C)     Temp Source 05/22/23 0908 Oral     SpO2 05/22/23 0908 95 %     Weight 05/22/23 0911 212 lb (96.2 kg)     Height 05/22/23 0911 5\' 8"  (1.727 m)     Head Circumference --      Peak Flow --      Pain Score 05/22/23 0911 5     Pain Loc --      Pain Education --      Exclude from Growth Chart --    No data found.  Updated Vital Signs BP 123/77 (BP Location: Left Arm)   Pulse 87   Temp 98.2 F (36.8 C) (Oral)   Resp 18   Ht 5\' 8"  (1.727 m)   Wt 212 lb (96.2 kg)   SpO2 95%   BMI 32.23 kg/m   Visual Acuity Right Eye Distance:   Left Eye Distance:   Bilateral  Distance:    Right Eye Near:   Left Eye Near:    Bilateral Near:  Physical Exam Vitals and nursing note reviewed.  Constitutional:      General: She is not in acute distress.    Appearance: Normal appearance.  HENT:     Head: Normocephalic.     Right Ear: Hearing, ear canal and external ear normal. Tympanic membrane is erythematous.     Left Ear: Tympanic membrane, ear canal and external ear normal.     Nose: Congestion present.     Mouth/Throat:     Mouth: Mucous membranes are moist.     Pharynx: Posterior oropharyngeal erythema present.     Comments: Cobblestoning present to posterior oropharynx Eyes:     Extraocular Movements: Extraocular movements intact.     Pupils: Pupils are equal, round, and reactive to light.  Cardiovascular:     Rate and Rhythm: Normal rate and regular rhythm.     Pulses: Normal pulses.     Heart sounds: Normal heart sounds.  Pulmonary:     Effort: Pulmonary effort is normal. No respiratory distress.     Breath sounds: Normal breath sounds. No stridor. No wheezing, rhonchi or rales.  Abdominal:     General: Bowel sounds are normal.     Palpations: Abdomen is soft.     Tenderness: There is no abdominal tenderness.  Musculoskeletal:     Cervical back: Normal range of motion.  Lymphadenopathy:     Cervical: No cervical adenopathy.  Skin:    General: Skin is warm and dry.  Neurological:     General: No focal deficit present.     Mental Status: She is alert and oriented to person, place, and time.  Psychiatric:        Mood and Affect: Mood normal.        Behavior: Behavior normal.      UC Treatments / Results  Labs (all labs ordered are listed, but only abnormal results are displayed) Labs Reviewed - No data to display  EKG   Radiology No results found.  Procedures Procedures (including critical care time)  Medications Ordered in UC Medications - No data to display  Initial Impression / Assessment and Plan / UC Course  I  have reviewed the triage vital signs and the nursing notes.  Pertinent labs & imaging results that were available during my care of the patient were reviewed by me and considered in my medical decision making (see chart for details).  The patient is well-appearing, she is in no acute distress, vital signs are stable.  Will treat patient empirically for acute sinusitis with Augmentin 875/125 mg tablets.  For her ear pain, there is suspicion for possible perforation, which will be covered with the Augmentin.  Supportive care recommendations were provided and discussed with the patient to include normal saline nasal spray, over-the-counter analgesics, and increasing fluids and allowing for plenty of rest.  Patient was advised to follow-up with her primary care physician or with ENT if symptoms do not improve.  Patient is in agreement with this plan of care and verbalizes understanding.  All questions were answered.  Patient stable for discharge.  Does not rain  Final Clinical Impressions(s) / UC Diagnoses   Final diagnoses:  Acute sinusitis, recurrence not specified, unspecified location  Acute otalgia, right     Discharge Instructions      Take medication as directed. Continue your current allergy medication regimen. Increase fluids and get plenty of rest. May take over-the-counter ibuprofen or Tylenol as needed for pain, fever, or general discomfort. Recommend normal saline nasal spray  to help with nasal congestion throughout the day. For your cough, it may be helpful to use a humidifier at bedtime during sleep.  For your ear: Warm compresses to the affected ear while symptoms persist. Do not stick or insert anything inside of the ear while symptoms are present. Avoid entrance of water inside of the ear while symptoms persist.  As discussed, if symptoms are not improving with this treatment, please follow-up with your primary care physician or with ENT for further evaluation. Follow-up  as needed.     ED Prescriptions     Medication Sig Dispense Auth. Provider   amoxicillin-clavulanate (AUGMENTIN) 875-125 MG tablet Take 1 tablet by mouth every 12 (twelve) hours. 14 tablet Alanys Godino-Warren, Sadie Haber, NP      PDMP not reviewed this encounter.   Abran Cantor, NP 05/22/23 1620

## 2023-05-22 NOTE — ED Triage Notes (Signed)
Patient reports sinus issues for the past 3 weeks and right ear pain started on Wednesday. Patient have been treating with Sinex nasal spray, Afrin and Tylenol.

## 2023-06-01 LAB — COLOGUARD

## 2023-06-01 LAB — EXTERNAL GENERIC LAB PROCEDURE

## 2023-06-04 DIAGNOSIS — G4733 Obstructive sleep apnea (adult) (pediatric): Secondary | ICD-10-CM | POA: Diagnosis not present

## 2023-07-04 DIAGNOSIS — Z1211 Encounter for screening for malignant neoplasm of colon: Secondary | ICD-10-CM | POA: Diagnosis not present

## 2023-07-05 DIAGNOSIS — G4733 Obstructive sleep apnea (adult) (pediatric): Secondary | ICD-10-CM | POA: Diagnosis not present

## 2023-07-09 LAB — COLOGUARD: COLOGUARD: NEGATIVE

## 2023-07-09 LAB — EXTERNAL GENERIC LAB PROCEDURE: COLOGUARD: NEGATIVE

## 2023-07-20 ENCOUNTER — Other Ambulatory Visit (HOSPITAL_COMMUNITY): Payer: Self-pay

## 2023-07-20 MED ORDER — ATORVASTATIN CALCIUM 10 MG PO TABS
10.0000 mg | ORAL_TABLET | Freq: Every day | ORAL | 0 refills | Status: DC
  Filled 2023-07-20: qty 90, 90d supply, fill #0

## 2023-07-21 ENCOUNTER — Other Ambulatory Visit (HOSPITAL_COMMUNITY): Payer: Self-pay

## 2023-07-21 ENCOUNTER — Encounter (HOSPITAL_COMMUNITY): Payer: Self-pay | Admitting: Pharmacist

## 2023-07-21 ENCOUNTER — Other Ambulatory Visit: Payer: Self-pay

## 2023-07-21 DIAGNOSIS — D485 Neoplasm of uncertain behavior of skin: Secondary | ICD-10-CM | POA: Diagnosis not present

## 2023-07-21 DIAGNOSIS — Z7189 Other specified counseling: Secondary | ICD-10-CM | POA: Diagnosis not present

## 2023-07-21 DIAGNOSIS — L821 Other seborrheic keratosis: Secondary | ICD-10-CM | POA: Diagnosis not present

## 2023-07-21 DIAGNOSIS — D0372 Melanoma in situ of left lower limb, including hip: Secondary | ICD-10-CM | POA: Diagnosis not present

## 2023-07-21 DIAGNOSIS — L814 Other melanin hyperpigmentation: Secondary | ICD-10-CM | POA: Diagnosis not present

## 2023-07-21 DIAGNOSIS — D2272 Melanocytic nevi of left lower limb, including hip: Secondary | ICD-10-CM | POA: Diagnosis not present

## 2023-07-21 DIAGNOSIS — D2371 Other benign neoplasm of skin of right lower limb, including hip: Secondary | ICD-10-CM | POA: Diagnosis not present

## 2023-07-21 MED ORDER — VALACYCLOVIR HCL 1 G PO TABS
2000.0000 mg | ORAL_TABLET | Freq: Two times a day (BID) | ORAL | 1 refills | Status: DC
  Filled 2023-07-21 – 2023-08-21 (×2): qty 20, 5d supply, fill #0
  Filled 2023-09-22: qty 20, 5d supply, fill #1

## 2023-07-26 ENCOUNTER — Other Ambulatory Visit: Payer: Self-pay

## 2023-08-04 DIAGNOSIS — G4733 Obstructive sleep apnea (adult) (pediatric): Secondary | ICD-10-CM | POA: Diagnosis not present

## 2023-08-21 ENCOUNTER — Other Ambulatory Visit (HOSPITAL_COMMUNITY): Payer: Self-pay

## 2023-08-23 ENCOUNTER — Other Ambulatory Visit: Payer: Self-pay

## 2023-08-23 DIAGNOSIS — G4733 Obstructive sleep apnea (adult) (pediatric): Secondary | ICD-10-CM | POA: Diagnosis not present

## 2023-08-23 DIAGNOSIS — L239 Allergic contact dermatitis, unspecified cause: Secondary | ICD-10-CM | POA: Diagnosis not present

## 2023-08-24 ENCOUNTER — Other Ambulatory Visit: Payer: Self-pay

## 2023-08-24 ENCOUNTER — Encounter: Payer: Self-pay | Admitting: Pharmacist

## 2023-08-25 ENCOUNTER — Other Ambulatory Visit (HOSPITAL_COMMUNITY): Payer: Self-pay

## 2023-08-31 ENCOUNTER — Other Ambulatory Visit (HOSPITAL_COMMUNITY): Payer: Self-pay | Admitting: Internal Medicine

## 2023-08-31 DIAGNOSIS — Z1231 Encounter for screening mammogram for malignant neoplasm of breast: Secondary | ICD-10-CM

## 2023-09-04 DIAGNOSIS — G4733 Obstructive sleep apnea (adult) (pediatric): Secondary | ICD-10-CM | POA: Diagnosis not present

## 2023-09-10 DIAGNOSIS — L905 Scar conditions and fibrosis of skin: Secondary | ICD-10-CM | POA: Diagnosis not present

## 2023-09-15 ENCOUNTER — Encounter: Payer: Self-pay | Admitting: Family Medicine

## 2023-09-19 DIAGNOSIS — G4733 Obstructive sleep apnea (adult) (pediatric): Secondary | ICD-10-CM | POA: Diagnosis not present

## 2023-09-20 ENCOUNTER — Ambulatory Visit (HOSPITAL_COMMUNITY): Payer: 59

## 2023-09-21 DIAGNOSIS — E785 Hyperlipidemia, unspecified: Secondary | ICD-10-CM | POA: Diagnosis not present

## 2023-09-21 DIAGNOSIS — R7303 Prediabetes: Secondary | ICD-10-CM | POA: Diagnosis not present

## 2023-09-22 ENCOUNTER — Other Ambulatory Visit (HOSPITAL_COMMUNITY): Payer: Self-pay

## 2023-09-22 DIAGNOSIS — G4733 Obstructive sleep apnea (adult) (pediatric): Secondary | ICD-10-CM | POA: Diagnosis not present

## 2023-09-23 ENCOUNTER — Encounter (HOSPITAL_COMMUNITY): Payer: Self-pay

## 2023-09-23 ENCOUNTER — Ambulatory Visit (HOSPITAL_COMMUNITY)
Admission: RE | Admit: 2023-09-23 | Discharge: 2023-09-23 | Disposition: A | Discharge status: Home / Self Care | Payer: 59 | Source: Ambulatory Visit | Attending: Internal Medicine | Admitting: Internal Medicine

## 2023-09-23 DIAGNOSIS — Z1231 Encounter for screening mammogram for malignant neoplasm of breast: Secondary | ICD-10-CM | POA: Diagnosis not present

## 2023-09-24 ENCOUNTER — Other Ambulatory Visit: Payer: Self-pay

## 2023-09-24 ENCOUNTER — Other Ambulatory Visit (HOSPITAL_COMMUNITY): Payer: Self-pay

## 2023-09-24 MED ORDER — ATOMOXETINE HCL 40 MG PO CAPS
40.0000 mg | ORAL_CAPSULE | Freq: Every day | ORAL | 6 refills | Status: DC
  Filled 2023-09-24: qty 30, 30d supply, fill #0
  Filled 2023-11-10: qty 30, 30d supply, fill #1
  Filled 2023-12-13: qty 30, 30d supply, fill #2
  Filled 2024-01-15: qty 30, 30d supply, fill #3
  Filled 2024-03-06: qty 30, 30d supply, fill #4
  Filled 2024-04-01: qty 30, 30d supply, fill #5
  Filled 2024-05-27: qty 30, 30d supply, fill #6

## 2023-10-01 ENCOUNTER — Telehealth: Payer: 59 | Admitting: Physician Assistant

## 2023-10-01 DIAGNOSIS — R3989 Other symptoms and signs involving the genitourinary system: Secondary | ICD-10-CM | POA: Diagnosis not present

## 2023-10-01 MED ORDER — SULFAMETHOXAZOLE-TRIMETHOPRIM 800-160 MG PO TABS: 1.0000 | ORAL_TABLET | Freq: Two times a day (BID) | ORAL | 0 refills | Status: DC

## 2023-10-01 NOTE — Progress Notes (Signed)
E-Visit for Urinary Problems  We are sorry that you are not feeling well.  Here is how we plan to help!  Based on what you shared with me it looks like you most likely have a simple urinary tract infection.  A UTI (Urinary Tract Infection) is a bacterial infection of the bladder.  Most cases of urinary tract infections are simple to treat but a key part of your care is to encourage you to drink plenty of fluids and watch your symptoms carefully.  I have prescribed Bactrim DS One tablet twice a day for 5 days.  Your symptoms should gradually improve. Call us if the burning in your urine worsens, you develop worsening fever, back pain or pelvic pain or if your symptoms do not resolve after completing the antibiotic.  Urinary tract infections can be prevented by drinking plenty of water to keep your body hydrated.  Also be sure when you wipe, wipe from front to back and don't hold it in!  If possible, empty your bladder every 4 hours.  HOME CARE Drink plenty of fluids Compete the full course of the antibiotics even if the symptoms resolve Remember, when you need to go.go. Holding in your urine can increase the likelihood of getting a UTI! GET HELP RIGHT AWAY IF: You cannot urinate You get a high fever Worsening back pain occurs You see blood in your urine You feel sick to your stomach or throw up You feel like you are going to pass out  MAKE SURE YOU  Understand these instructions. Will watch your condition. Will get help right away if you are not doing well or get worse.   Thank you for choosing an e-visit.  Your e-visit answers were reviewed by a board certified advanced clinical practitioner to complete your personal care plan. Depending upon the condition, your plan could have included both over the counter or prescription medications.  Please review your pharmacy choice. Make sure the pharmacy is open so you can pick up prescription now. If there is a problem, you may contact  your provider through MyChart messaging and have the prescription routed to another pharmacy.  Your safety is important to us. If you have drug allergies check your prescription carefully.   For the next 24 hours you can use MyChart to ask questions about today's visit, request a non-urgent call back, or ask for a work or school excuse. You will get an email in the next two days asking about your experience. I hope that your e-visit has been valuable and will speed your recovery.  I have spent 5 minutes in review of e-visit questionnaire, review and updating patient chart, medical decision making and response to patient.   Jennifer M Burnette, PA-C  

## 2023-10-04 DIAGNOSIS — G4733 Obstructive sleep apnea (adult) (pediatric): Secondary | ICD-10-CM | POA: Diagnosis not present

## 2023-10-17 ENCOUNTER — Other Ambulatory Visit (HOSPITAL_COMMUNITY): Payer: Self-pay

## 2023-10-19 ENCOUNTER — Other Ambulatory Visit (HOSPITAL_COMMUNITY): Payer: Self-pay

## 2023-10-19 MED ORDER — ATORVASTATIN CALCIUM 10 MG PO TABS
10.0000 mg | ORAL_TABLET | Freq: Every day | ORAL | 0 refills | Status: DC
  Filled 2023-10-19: qty 90, 90d supply, fill #0

## 2023-10-20 ENCOUNTER — Other Ambulatory Visit: Payer: Self-pay

## 2023-10-21 ENCOUNTER — Other Ambulatory Visit: Payer: Self-pay | Admitting: Adult Health

## 2023-10-21 ENCOUNTER — Other Ambulatory Visit: Payer: Commercial Managed Care - PPO | Admitting: *Deleted

## 2023-10-21 DIAGNOSIS — Z87440 Personal history of urinary (tract) infections: Secondary | ICD-10-CM | POA: Diagnosis not present

## 2023-10-21 DIAGNOSIS — R350 Frequency of micturition: Secondary | ICD-10-CM

## 2023-10-21 DIAGNOSIS — R3915 Urgency of urination: Secondary | ICD-10-CM | POA: Diagnosis not present

## 2023-10-21 LAB — POCT URINALYSIS DIPSTICK OB
Glucose, UA: NEGATIVE
Leukocytes, UA: NEGATIVE
Nitrite, UA: NEGATIVE
POC,PROTEIN,UA: NEGATIVE

## 2023-10-21 MED ORDER — NITROFURANTOIN MONOHYD MACRO 100 MG PO CAPS: 100.0000 mg | ORAL_CAPSULE | Freq: Two times a day (BID) | ORAL | 0 refills | Status: AC

## 2023-10-21 NOTE — Progress Notes (Signed)
   NURSE VISIT- UTI SYMPTOMS   SUBJECTIVE:  Amy Durham is a 48 y.o. [phone] female here for UTI symptoms. She is a GYN patient. She reports flank pain bilaterally, urinary frequency, and urinary urgency. Did an E-visit on 10/01/23 and was prescribed Bactrim in which she took all of the medication and was starting to feel better but symptoms have returned. Requesting a different antibiotic.  OBJECTIVE:  There were no vitals taken for this visit.  Appears well, in no apparent distress  Results for orders placed or performed in visit on 10/21/23 (from the past 24 hours)  POC Urinalysis Dipstick OB   Collection Time: 10/21/23  4:25 PM  Result Value Ref Range   Color, UA     Clarity, UA     Glucose, UA Negative Negative   Bilirubin, UA     Ketones, UA trace    Spec Grav, UA     Blood, UA trace    pH, UA     POC,PROTEIN,UA Negative Negative, Trace, Small (1+), Moderate (2+), Large (3+), 4+   Urobilinogen, UA     Nitrite, UA neg    Leukocytes, UA Negative Negative   Appearance     Odor      ASSESSMENT: GYN patient with UTI symptoms and negative nitrites  PLAN: Note routed to Cyril Mourning, AGNP   Rx sent by provider today: Yes, pt requesting Urine culture sent Call or return to clinic prn if these symptoms worsen or fail to improve as anticipated. Follow-up: as needed   Jobe Marker  10/21/2023 4:31 PM

## 2023-10-21 NOTE — Progress Notes (Signed)
Rx macrobid  

## 2023-10-22 LAB — URINALYSIS, ROUTINE W REFLEX MICROSCOPIC
Bilirubin, UA: NEGATIVE
Glucose, UA: NEGATIVE
Ketones, UA: NEGATIVE
Nitrite, UA: NEGATIVE
Protein,UA: NEGATIVE
Specific Gravity, UA: 1.018 (ref 1.005–1.030)
Urobilinogen, Ur: 0.2 mg/dL (ref 0.2–1.0)
pH, UA: 5.5 (ref 5.0–7.5)

## 2023-10-22 LAB — MICROSCOPIC EXAMINATION: Casts: NONE SEEN /[LPF]

## 2023-10-23 DIAGNOSIS — G4733 Obstructive sleep apnea (adult) (pediatric): Secondary | ICD-10-CM | POA: Diagnosis not present

## 2023-10-23 LAB — URINE CULTURE

## 2023-11-04 DIAGNOSIS — G4733 Obstructive sleep apnea (adult) (pediatric): Secondary | ICD-10-CM | POA: Diagnosis not present

## 2023-11-09 ENCOUNTER — Other Ambulatory Visit: Payer: Commercial Managed Care - PPO

## 2023-11-11 ENCOUNTER — Other Ambulatory Visit (HOSPITAL_COMMUNITY): Payer: Self-pay

## 2023-12-03 DIAGNOSIS — G4733 Obstructive sleep apnea (adult) (pediatric): Secondary | ICD-10-CM | POA: Diagnosis not present

## 2023-12-13 ENCOUNTER — Other Ambulatory Visit (HOSPITAL_COMMUNITY): Payer: Self-pay

## 2023-12-14 ENCOUNTER — Other Ambulatory Visit (HOSPITAL_COMMUNITY): Payer: Self-pay

## 2023-12-14 MED ORDER — ESCITALOPRAM OXALATE 20 MG PO TABS
20.0000 mg | ORAL_TABLET | Freq: Every day | ORAL | 3 refills | Status: AC
  Filled 2023-12-14: qty 90, 90d supply, fill #0
  Filled 2024-04-01: qty 90, 90d supply, fill #1
  Filled 2024-06-28 – 2024-09-01 (×2): qty 90, 90d supply, fill #2

## 2023-12-14 MED ORDER — LISINOPRIL 10 MG PO TABS
10.0000 mg | ORAL_TABLET | Freq: Every day | ORAL | 1 refills | Status: DC
  Filled 2023-12-14: qty 90, 90d supply, fill #0
  Filled 2024-04-01: qty 90, 90d supply, fill #1

## 2024-01-02 DIAGNOSIS — G4733 Obstructive sleep apnea (adult) (pediatric): Secondary | ICD-10-CM | POA: Diagnosis not present

## 2024-01-15 ENCOUNTER — Other Ambulatory Visit (HOSPITAL_COMMUNITY): Payer: Self-pay

## 2024-01-17 ENCOUNTER — Other Ambulatory Visit (HOSPITAL_COMMUNITY): Payer: Self-pay

## 2024-01-17 ENCOUNTER — Other Ambulatory Visit: Payer: Self-pay

## 2024-01-17 MED ORDER — ATORVASTATIN CALCIUM 10 MG PO TABS
10.0000 mg | ORAL_TABLET | Freq: Every day | ORAL | 0 refills | Status: DC
  Filled 2024-01-17: qty 90, 90d supply, fill #0

## 2024-01-19 ENCOUNTER — Other Ambulatory Visit: Payer: Self-pay

## 2024-01-19 ENCOUNTER — Other Ambulatory Visit (HOSPITAL_COMMUNITY): Payer: Self-pay

## 2024-01-19 DIAGNOSIS — M5459 Other low back pain: Secondary | ICD-10-CM | POA: Diagnosis not present

## 2024-01-19 MED ORDER — METHOCARBAMOL 750 MG PO TABS
750.0000 mg | ORAL_TABLET | Freq: Every day | ORAL | 1 refills | Status: AC
  Filled 2024-01-19: qty 30, 30d supply, fill #0
  Filled 2024-03-06: qty 30, 30d supply, fill #1

## 2024-01-24 ENCOUNTER — Other Ambulatory Visit (HOSPITAL_COMMUNITY): Payer: Self-pay | Admitting: Family Medicine

## 2024-01-24 ENCOUNTER — Ambulatory Visit (HOSPITAL_COMMUNITY)
Admission: RE | Admit: 2024-01-24 | Discharge: 2024-01-24 | Disposition: A | Discharge status: Home / Self Care | Source: Ambulatory Visit | Attending: Family Medicine | Admitting: Family Medicine

## 2024-01-24 DIAGNOSIS — M545 Low back pain, unspecified: Secondary | ICD-10-CM

## 2024-01-24 DIAGNOSIS — M47816 Spondylosis without myelopathy or radiculopathy, lumbar region: Secondary | ICD-10-CM | POA: Diagnosis not present

## 2024-02-02 ENCOUNTER — Other Ambulatory Visit (HOSPITAL_COMMUNITY): Payer: Self-pay

## 2024-02-02 ENCOUNTER — Other Ambulatory Visit: Payer: Self-pay

## 2024-02-02 DIAGNOSIS — G4733 Obstructive sleep apnea (adult) (pediatric): Secondary | ICD-10-CM | POA: Diagnosis not present

## 2024-02-02 MED ORDER — DICLOFENAC SODIUM 50 MG PO TBEC
50.0000 mg | DELAYED_RELEASE_TABLET | Freq: Two times a day (BID) | ORAL | 0 refills | Status: AC
  Filled 2024-02-02: qty 60, 30d supply, fill #0

## 2024-02-05 DIAGNOSIS — E66812 Obesity, class 2: Secondary | ICD-10-CM | POA: Diagnosis not present

## 2024-02-05 DIAGNOSIS — Z713 Dietary counseling and surveillance: Secondary | ICD-10-CM | POA: Diagnosis not present

## 2024-02-05 DIAGNOSIS — Z6836 Body mass index (BMI) 36.0-36.9, adult: Secondary | ICD-10-CM | POA: Diagnosis not present

## 2024-02-11 DIAGNOSIS — M6281 Muscle weakness (generalized): Secondary | ICD-10-CM | POA: Diagnosis not present

## 2024-02-11 DIAGNOSIS — M549 Dorsalgia, unspecified: Secondary | ICD-10-CM | POA: Diagnosis not present

## 2024-02-11 DIAGNOSIS — M2560 Stiffness of unspecified joint, not elsewhere classified: Secondary | ICD-10-CM | POA: Diagnosis not present

## 2024-02-15 DIAGNOSIS — M2560 Stiffness of unspecified joint, not elsewhere classified: Secondary | ICD-10-CM | POA: Diagnosis not present

## 2024-02-15 DIAGNOSIS — M549 Dorsalgia, unspecified: Secondary | ICD-10-CM | POA: Diagnosis not present

## 2024-02-15 DIAGNOSIS — M6281 Muscle weakness (generalized): Secondary | ICD-10-CM | POA: Diagnosis not present

## 2024-02-18 DIAGNOSIS — M2560 Stiffness of unspecified joint, not elsewhere classified: Secondary | ICD-10-CM | POA: Diagnosis not present

## 2024-02-18 DIAGNOSIS — M549 Dorsalgia, unspecified: Secondary | ICD-10-CM | POA: Diagnosis not present

## 2024-02-18 DIAGNOSIS — M6281 Muscle weakness (generalized): Secondary | ICD-10-CM | POA: Diagnosis not present

## 2024-02-22 DIAGNOSIS — M2560 Stiffness of unspecified joint, not elsewhere classified: Secondary | ICD-10-CM | POA: Diagnosis not present

## 2024-02-22 DIAGNOSIS — M6281 Muscle weakness (generalized): Secondary | ICD-10-CM | POA: Diagnosis not present

## 2024-02-22 DIAGNOSIS — M549 Dorsalgia, unspecified: Secondary | ICD-10-CM | POA: Diagnosis not present

## 2024-03-03 DIAGNOSIS — G4733 Obstructive sleep apnea (adult) (pediatric): Secondary | ICD-10-CM | POA: Diagnosis not present

## 2024-03-07 ENCOUNTER — Other Ambulatory Visit: Payer: Self-pay

## 2024-03-10 DIAGNOSIS — Z6835 Body mass index (BMI) 35.0-35.9, adult: Secondary | ICD-10-CM | POA: Diagnosis not present

## 2024-03-10 DIAGNOSIS — Z713 Dietary counseling and surveillance: Secondary | ICD-10-CM | POA: Diagnosis not present

## 2024-03-10 DIAGNOSIS — E66812 Obesity, class 2: Secondary | ICD-10-CM | POA: Diagnosis not present

## 2024-03-20 DIAGNOSIS — G4733 Obstructive sleep apnea (adult) (pediatric): Secondary | ICD-10-CM | POA: Diagnosis not present

## 2024-03-28 DIAGNOSIS — E785 Hyperlipidemia, unspecified: Secondary | ICD-10-CM | POA: Diagnosis not present

## 2024-03-28 DIAGNOSIS — R7303 Prediabetes: Secondary | ICD-10-CM | POA: Diagnosis not present

## 2024-04-01 ENCOUNTER — Other Ambulatory Visit (HOSPITAL_COMMUNITY): Payer: Self-pay

## 2024-05-27 ENCOUNTER — Other Ambulatory Visit (HOSPITAL_COMMUNITY): Payer: Self-pay

## 2024-05-29 ENCOUNTER — Other Ambulatory Visit: Payer: Self-pay

## 2024-05-29 ENCOUNTER — Other Ambulatory Visit (HOSPITAL_COMMUNITY): Payer: Self-pay

## 2024-05-29 MED ORDER — ATORVASTATIN CALCIUM 10 MG PO TABS
10.0000 mg | ORAL_TABLET | Freq: Every day | ORAL | 0 refills | Status: DC
  Filled 2024-05-29: qty 90, 90d supply, fill #0

## 2024-06-19 DIAGNOSIS — G4733 Obstructive sleep apnea (adult) (pediatric): Secondary | ICD-10-CM | POA: Diagnosis not present

## 2024-06-28 ENCOUNTER — Other Ambulatory Visit (HOSPITAL_COMMUNITY): Payer: Self-pay

## 2024-06-29 ENCOUNTER — Encounter: Payer: Self-pay | Admitting: Pharmacist

## 2024-06-29 ENCOUNTER — Other Ambulatory Visit: Payer: Self-pay

## 2024-06-29 ENCOUNTER — Other Ambulatory Visit (HOSPITAL_COMMUNITY): Payer: Self-pay

## 2024-06-29 MED ORDER — LISINOPRIL 10 MG PO TABS
10.0000 mg | ORAL_TABLET | Freq: Every day | ORAL | 1 refills | Status: AC
  Filled 2024-06-29: qty 90, 90d supply, fill #0
  Filled 2024-10-19: qty 90, 90d supply, fill #1

## 2024-06-29 MED ORDER — VALACYCLOVIR HCL 1 G PO TABS
2000.0000 mg | ORAL_TABLET | Freq: Two times a day (BID) | ORAL | 1 refills | Status: AC
  Filled 2024-06-29 – 2024-09-01 (×2): qty 20, 5d supply, fill #0
  Filled 2024-10-19: qty 20, 5d supply, fill #1

## 2024-06-30 ENCOUNTER — Encounter: Payer: Self-pay | Admitting: Pharmacist

## 2024-06-30 ENCOUNTER — Other Ambulatory Visit (HOSPITAL_COMMUNITY): Payer: Self-pay

## 2024-06-30 ENCOUNTER — Other Ambulatory Visit: Payer: Self-pay

## 2024-06-30 MED ORDER — ATORVASTATIN CALCIUM 10 MG PO TABS
10.0000 mg | ORAL_TABLET | Freq: Every day | ORAL | 0 refills | Status: AC
  Filled 2024-06-30 – 2024-09-01 (×2): qty 90, 90d supply, fill #0

## 2024-06-30 MED ORDER — ATOMOXETINE HCL 40 MG PO CAPS
40.0000 mg | ORAL_CAPSULE | Freq: Every day | ORAL | 6 refills | Status: AC
  Filled 2024-06-30: qty 30, 30d supply, fill #0
  Filled 2024-09-01: qty 30, 30d supply, fill #1
  Filled 2024-10-19: qty 30, 30d supply, fill #2

## 2024-07-03 ENCOUNTER — Other Ambulatory Visit (HOSPITAL_COMMUNITY): Payer: Self-pay

## 2024-07-04 ENCOUNTER — Other Ambulatory Visit: Payer: Self-pay

## 2024-08-04 ENCOUNTER — Ambulatory Visit: Admitting: Allergy & Immunology

## 2024-08-04 ENCOUNTER — Other Ambulatory Visit: Payer: Self-pay

## 2024-08-04 ENCOUNTER — Encounter: Payer: Self-pay | Admitting: Allergy & Immunology

## 2024-08-04 VITALS — BP 120/80 | HR 102 | Temp 97.7°F | Resp 18 | Ht 67.72 in | Wt 234.6 lb

## 2024-08-04 DIAGNOSIS — J310 Chronic rhinitis: Secondary | ICD-10-CM

## 2024-08-04 DIAGNOSIS — J4520 Mild intermittent asthma, uncomplicated: Secondary | ICD-10-CM

## 2024-08-04 NOTE — Patient Instructions (Addendum)
 1. Chronic rhinitis - Because of insurance stipulations, we cannot do skin testing on the same day as your first visit. - We are all working to fight this, but for now we need to do two separate visits.  - We will know more after we do testing at the next visit.  - The skin testing visit can be squeezed in at your convenience.  - Then we can make a more full plan to address all of your symptoms. - Be sure to stop your antihistamines for 3 days before this appointment.   2. Return in about 1 week (around 08/11/2024). You can have the follow up appointment with Dr. Iva or a Nurse Practicioner (our Nurse Practitioners are excellent and always have Physician oversight!).    Please inform us  of any Emergency Department visits, hospitalizations, or changes in symptoms. Call us  before going to the ED for breathing or allergy symptoms since we might be able to fit you in for a sick visit. Feel free to contact us  anytime with any questions, problems, or concerns.  It was a pleasure to meet you today!  Websites that have reliable patient information: 1. American Academy of Asthma, Allergy, and Immunology: www.aaaai.org 2. Food Allergy Research and Education (FARE): foodallergy.org 3. Mothers of Asthmatics: http://www.asthmacommunitynetwork.org 4. American College of Allergy, Asthma, and Immunology: www.acaai.org      "Like" us  on Facebook and Instagram for our latest updates!      A healthy democracy works best when [employer] participate! Make sure you are registered to vote! If you have moved or changed any of your contact information, you will need to get this updated before voting! Scan the QR codes below to learn more!

## 2024-08-04 NOTE — Progress Notes (Signed)
 NEW PATIENT  Date of Service/Encounter:  08/04/24  Consult requested by: Jayne Vonn DEL, MD   Assessment:   Chronic rhinitis - planning for skin testing at the next visit  Intermittent asthma - on albuterol  as needed (rarely uses)   ADHD - on Strattera   Plan/Recommendations:   1. Chronic rhinitis - Because of insurance stipulations, we cannot do skin testing on the same day as your first visit. - We are all working to fight this, but for now we need to do two separate visits.  - We will know more after we do testing at the next visit.  - The skin testing visit can be squeezed in at your convenience.  - Then we can make a more full plan to address all of your symptoms. - Be sure to stop your antihistamines for 3 days before this appointment.   2. Return in about 1 week (around 08/11/2024). You can have the follow up appointment with Dr. Iva or a Nurse Practicioner (our Nurse Practitioners are excellent and always have Physician oversight!).    This note in its entirety was forwarded to the Provider who requested this consultation.  Subjective:   Amy Durham is a 48 y.o. female presenting today for evaluation of  Chief Complaint  Patient presents with   Establish Care   Sinus Problem    Amy Durham has a history of the following: Patient Active Problem List   Diagnosis Date Noted   Hypertension, benign essential, goal below 140/90 01/14/2023   Hypercholesteremia 01/14/2023   Recurrent sinusitis 10/21/2022   Snoring 10/21/2022   Common bile duct stone     History obtained from: chart review and patient.  Discussed the use of AI scribe software for clinical note transcription with the patient and/or guardian, who gave verbal consent to proceed.  Amy Durham was referred by Jayne Vonn DEL, MD.     Amy Durham is a 48 y.o. female presenting for an evaluation of chronic sinusitis.  Asthma/Respiratory Symptom History: She has albuterol  to use as needed. She has not  been on a controller medication at all.   Allergic Rhinitis Symptom History: She takes allergy medications every day. She alternates Allegra and Zyrtec  and whatever else. She will use a nose spray when it gets bad. She was never tested and was never on shots. She grew up here and had a lot of nosebleeds when she was growing up. Symptoms seem to progress throughout the year. She has no good time of the year. She is not routinely on antibiotics for any sinus infections. She denies any migraines, although she will get some headaches.   She has Allegra, cetirizine , loratadine, and Flonase  listed in her medications that she has tried.    She has ADHD and is on Strattera .   It does look like she was treated with a sinus infection in August 2024. Otherwise, there are no visits for sinusitis or other infections.   Otherwise, there is no history of other atopic diseases, including drug allergies, stinging insect allergies, or contact dermatitis. There is no significant infectious history. Vaccinations are up to date.    Past Medical History: Patient Active Problem List   Diagnosis Date Noted   Hypertension, benign essential, goal below 140/90 01/14/2023   Hypercholesteremia 01/14/2023   Recurrent sinusitis 10/21/2022   Snoring 10/21/2022   Common bile duct stone     Medication List:  Allergies as of 08/04/2024   No Known Allergies  Medication List        Accurate as of August 04, 2024 11:59 PM. If you have any questions, ask your nurse or doctor.          albuterol  108 (90 Base) MCG/ACT inhaler Commonly known as: VENTOLIN  HFA Inhale 1-2 puffs into the lungs every 6 (six) hours as needed for wheezing or shortness of breath.   atomoxetine  40 MG capsule Commonly known as: STRATTERA  Take 1 capsule (40 mg total) by mouth daily.   atorvastatin  10 MG tablet Commonly known as: LIPITOR Take 1 tablet (10 mg total) by mouth at bedtime.   cetirizine  10 MG tablet Commonly known  as: ZyrTEC  Allergy Take 1 tablet (10 mg total) by mouth daily.   diclofenac  50 MG EC tablet Commonly known as: VOLTAREN  Take 1 tablet (50 mg total) by mouth 2 (two) times daily.   escitalopram  20 MG tablet Commonly known as: LEXAPRO  Take 1 tablet (20 mg total) by mouth daily.   fexofenadine 180 MG tablet Commonly known as: ALLEGRA Take by mouth.   fluticasone  50 MCG/ACT nasal spray Commonly known as: FLONASE  Place 1 spray into both nostrils daily for 14 days.   lisinopril  10 MG tablet Commonly known as: ZESTRIL  Take 1 tablet (10 mg total) by mouth daily.   loratadine 10 MG tablet Commonly known as: CLARITIN Take 10 mg by mouth daily.   methocarbamol  750 MG tablet Commonly known as: ROBAXIN  Take 1 tablet (750 mg total) by mouth daily.   Omega-3 1000 MG Caps Take by mouth.   RA Probiotic Digestive Care Caps Take 1 capsule by mouth daily.   valACYclovir  1000 MG tablet Commonly known as: VALTREX  Take 2 tablets (2,000 mg total) by mouth every 12 (twelve) hours for 1 day.        Birth History: non-contributory  Developmental History: non-contributory  Past Surgical History: Past Surgical History:  Procedure Laterality Date   BIOPSY  06/07/2015   Procedure: BIOPSY (GASTRIC);  Surgeon: Margo LITTIE Haddock, MD;  Location: [facility];  Service: Endoscopy;;   CHOLECYSTECTOMY N/A 05/22/2015   Procedure: LAPAROSCOPIC CHOLECYSTECTOMY;  Surgeon: Oneil Budge, MD;  Location: [facility];  Service: General;  Laterality: N/A;   ERCP N/A 06/07/2015   Procedure: ENDOSCOPIC RETROGRADE CHOLANGIOPANCREATOGRAPHY (ERCP);  Surgeon: Margo LITTIE Haddock, MD;  Location: [facility];  Service: Endoscopy;  Laterality: N/A;   REMOVAL OF STONES N/A 06/07/2015   Procedure: REMOVAL OF COMMON BILE DUCT STONES (3);  Surgeon: Margo LITTIE Haddock, MD;  Location: [facility];  Service: Endoscopy;  Laterality: N/A;   SPHINCTEROTOMY N/A 06/07/2015   Procedure: SPHINCTEROTOMY;  Surgeon: Margo LITTIE Haddock, MD;  Location: [facility];  Service:  Endoscopy;  Laterality: N/A;     Family History: Family History  Problem Relation Age of Onset   Breast cancer Mother 57     Social History: Rosaline lives at home with her family. They live in a double wide trailer. There is carpeting in the main living areas and LVP in hte bedroom. There is electric heating and central cooling. There is a dog inside of the home. There are no dust mite coverings on the bedding. There is no tobacco exposures in the home. 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 There is no exposure to fumes, chemicals, or dust. They do not live near an interstate or industrial area. There os no smoking exposure at all.    Review of systems otherwise negative other than that mentioned in the  HPI.    Objective:   Blood pressure 120/80, pulse (!) 102, temperature 97.7 F (36.5 C), temperature source Temporal, resp. rate 18, height 5' 7.72 (1.72 m), weight 234 lb 9.6 oz (106.4 kg), SpO2 98%. Body mass index is 35.97 kg/m.     Physical Exam Vitals reviewed.  Constitutional:      Appearance: She is well-developed.     Comments: Friendly. Talkative.   HENT:     Head: Normocephalic and atraumatic.     Right Ear: Tympanic membrane, ear canal and external ear normal. No drainage, swelling or tenderness. Tympanic membrane is not injected, scarred, erythematous, retracted or bulging.     Left Ear: Tympanic membrane, ear canal and external ear normal. No drainage, swelling or tenderness. Tympanic membrane is not injected, scarred, erythematous, retracted or bulging.     Nose: No nasal deformity, septal deviation, mucosal edema or rhinorrhea.     Right Turbinates: Enlarged, swollen and pale.     Left Turbinates: Enlarged, swollen and pale.     Right Sinus: No maxillary sinus tenderness or frontal sinus tenderness.     Left Sinus: No maxillary sinus tenderness or frontal sinus tenderness.     Mouth/Throat:     Mouth: Mucous membranes are not  pale and not dry.     Pharynx: Uvula midline.  Eyes:     General: Lids are normal. Allergic shiner present.        Right eye: No discharge.        Left eye: No discharge.     Conjunctiva/sclera: Conjunctivae normal.     Right eye: Right conjunctiva is not injected. No chemosis.    Left eye: Left conjunctiva is not injected. No chemosis.    Pupils: Pupils are equal, round, and reactive to light.  Cardiovascular:     Rate and Rhythm: Normal rate and regular rhythm.     Heart sounds: Normal heart sounds.  Pulmonary:     Effort: Pulmonary effort is normal. No tachypnea, accessory muscle usage or respiratory distress.     Breath sounds: Normal breath sounds. No wheezing, rhonchi or rales.     Comments: Moving air well in all lung fields. No increased work of breathing noted.  Chest:     Chest wall: No tenderness.  Abdominal:     Tenderness: There is no abdominal tenderness. There is no guarding or rebound.  Lymphadenopathy:     Head:     Right side of head: No submandibular, tonsillar or occipital adenopathy.     Left side of head: No submandibular, tonsillar or occipital adenopathy.     Cervical: No cervical adenopathy.  Skin:    Coloration: Skin is not pale.     Findings: No abrasion, erythema, petechiae or rash. Rash is not papular, urticarial or vesicular.  Neurological:     Mental Status: She is alert.  Psychiatric:        Behavior: Behavior is cooperative.      Diagnostic studies: deferred due to insurance stipulations that require a separate visit for testing         Marty Shaggy, MD [facility] 

## 2024-08-18 ENCOUNTER — Ambulatory Visit: Admitting: Allergy & Immunology

## 2024-09-01 ENCOUNTER — Other Ambulatory Visit (HOSPITAL_COMMUNITY): Payer: Self-pay

## 2024-09-01 ENCOUNTER — Other Ambulatory Visit: Payer: Self-pay

## 2024-09-08 ENCOUNTER — Ambulatory Visit: Admitting: Allergy & Immunology

## 2024-09-08 ENCOUNTER — Other Ambulatory Visit: Payer: Self-pay

## 2024-09-08 ENCOUNTER — Encounter: Payer: Self-pay | Admitting: Allergy & Immunology

## 2024-09-08 ENCOUNTER — Other Ambulatory Visit (HOSPITAL_COMMUNITY): Payer: Self-pay

## 2024-09-08 DIAGNOSIS — J3089 Other allergic rhinitis: Secondary | ICD-10-CM | POA: Diagnosis not present

## 2024-09-08 DIAGNOSIS — J302 Other seasonal allergic rhinitis: Secondary | ICD-10-CM

## 2024-09-08 MED ORDER — LEVOCETIRIZINE DIHYDROCHLORIDE 5 MG PO TABS
5.0000 mg | ORAL_TABLET | Freq: Every evening | ORAL | 1 refills | Status: AC
  Filled 2024-09-08: qty 90, 90d supply, fill #0

## 2024-09-08 MED ORDER — MONTELUKAST SODIUM 10 MG PO TABS
10.0000 mg | ORAL_TABLET | Freq: Every day | ORAL | 1 refills | Status: AC
  Filled 2024-09-08: qty 90, 90d supply, fill #0

## 2024-09-08 NOTE — Patient Instructions (Addendum)
 1. Chronic rhinitis - Testing today showed: grasses, ragweed, weeds, trees, indoor molds, outdoor molds, dust mites, cat, dog, and horse - Copy of test results provided.  - Avoidance measures provided. - Stop taking: current medications - Start taking: Xyzal  (levocetirizine) 5mg  tablet once daily and Singulair  (montelukast ) 10mg  daily - You can use an extra dose of the antihistamine, if needed, for breakthrough symptoms.  - Consider nasal saline rinses 1-2 times daily to remove allergens from the nasal cavities as well as help with mucous clearance (this is especially helpful to do before the nasal sprays are given) - Consider allergy  shots as a means of long-term control. - Allergy  shots re-train and reset the immune system to ignore environmental allergens and decrease the resulting immune response to those allergens (sneezing, itchy watery eyes, runny nose, nasal congestion, etc).    - Allergy  shots improve symptoms in 75-85% of patients.  - We can discuss more at the next appointment if the medications are not working for you.  2. Return in about 6 weeks (around 10/20/2024). You can have the follow up appointment with Dr. Iva or a Nurse Practicioner (our Nurse Practitioners are excellent and always have Physician oversight!).    Please inform us  of any Emergency Department visits, hospitalizations, or changes in symptoms. Call us  before going to the ED for breathing or allergy  symptoms since we might be able to fit you in for a sick visit. Feel free to contact us  anytime with any questions, problems, or concerns.  It was a pleasure to meet you today!  Websites that have reliable patient information: 1. American Academy of Asthma, Allergy , and Immunology: www.aaaai.org 2. Food Allergy  Research and Education (FARE): foodallergy.org 3. Mothers of Asthmatics: http://www.asthmacommunitynetwork.org 4. American College of Allergy , Asthma, and Immunology: www.acaai.org      "Like"  us  on Facebook and Instagram for our latest updates!      A healthy democracy works best when [employer] participate! Make sure you are registered to vote! If you have moved or changed any of your contact information, you will need to get this updated before voting! Scan the QR codes below to learn more!       Airborne Adult Perc - 09/08/24 1341     Time Antigen Placed 1341    Allergen Manufacturer Jestine    Location Back    Number of Test 55    1. Control-Buffer 50% Glycerol Negative    2. Control-Histamine 3+    3. Bahia 2+    4. Bermuda Negative    5. Johnson Negative    6. [place]  Blue Negative    7. Meadow Fescue Negative    8. Perennial Rye 2+    9. Timothy Negative    10. Ragweed Mix Negative    11. Cocklebur Negative    12. Plantain,  English Negative    13. Baccharis Negative    14. Dog Fennel 2+    15. Russian Thistle Negative    16. Lamb's Quarters Negative    17. Sheep Sorrell Negative    18. Rough Pigweed Negative    19. Marsh Elder, Rough Negative    20. Mugwort, Common Negative    21. Box, Elder Negative    22. Cedar, red 2+    23. Sweet Gum 2+    24. Pecan Pollen 2+    25. Pine Mix Negative    26. Walnut, Black Pollen Negative    27. Red Mulberry Negative    28. Ash Mix  Negative    29. Birch Mix Negative    30. Beech American Negative    31. Cottonwood, Eastern Negative    32. Hickory, White Negative    33. Maple Mix Negative    34. Oak, Eastern Mix Negative    35. Sycamore Eastern Negative    36. Alternaria Alternata Negative    37. Cladosporium Herbarum Negative    38. Aspergillus Mix Negative    39. Penicillium Mix Negative    40. Bipolaris Sorokiniana (Helminthosporium) Negative    41. Drechslera Spicifera (Curvularia) Negative    42. Mucor Plumbeus Negative    43. Fusarium Moniliforme Negative    44. Aureobasidium Pullulans (pullulara) Negative    45. Rhizopus Oryzae Negative    46. Botrytis Cinera Negative    47. Epicoccum Nigrum  Negative    48. Phoma Betae Negative    49. Dust Mite Mix Negative    50. Cat Hair 10,000 BAU/ml Negative    51.  Dog Epithelia 2+    52. Mixed Feathers Negative    53. Horse Epithelia 2+    54. Cockroach, German Negative    55. Tobacco Leaf Negative          Intradermal - 09/08/24 1408     Time Antigen Placed 1415    Allergen Manufacturer Jestine    Location Arm    Number of Test 12    Control Negative    Bermuda Negative    Johnson 2+    Ragweed Mix 3+    Weed Mix 3+    Mold 1 3+    Mold 2 3+    Mold 3 3+    Mold 4 4+    Mite Mix 3+    Cat 3+    Cockroach Negative           Reducing Pollen Exposure  The American Academy of Allergy , Asthma and Immunology suggests the following steps to reduce your exposure to pollen during allergy  seasons.    Do not hang sheets or clothing out to dry; pollen may collect on these items. Do not mow lawns or spend time around freshly cut grass; mowing stirs up pollen. Keep windows closed at night.  Keep car windows closed while driving. Minimize morning activities outdoors, a time when pollen counts are usually at their highest. Stay indoors as much as possible when pollen counts or humidity is high and on windy days when pollen tends to remain in the air longer. Use air conditioning when possible.  Many air conditioners have filters that trap the pollen spores. Use a HEPA room air filter to remove pollen form the indoor air you breathe.  Control of Mold Allergen   Mold and fungi can grow on a variety of surfaces provided certain temperature and moisture conditions exist.  Outdoor molds grow on plants, decaying vegetation and soil.  The major outdoor mold, Alternaria and Cladosporium, are found in very high numbers during hot and dry conditions.  Generally, a late Summer - Fall peak is seen for common outdoor fungal spores.  Rain will temporarily lower outdoor mold spore count, but counts rise rapidly when the rainy period ends.  The  most important indoor molds are Aspergillus and Penicillium.  Dark, humid and poorly ventilated basements are ideal sites for mold growth.  The next most common sites of mold growth are the bathroom and the kitchen.  Outdoor (Seasonal) Mold Control  Positive outdoor molds via skin testing: Alternaria, Cladosporium, Bipolaris (Helminthsporium), Drechslera (Curvalaria), and  Mucor  Use air conditioning and keep windows closed Avoid exposure to decaying vegetation. Avoid leaf raking. Avoid grain handling. Consider wearing a face mask if working in moldy areas.    Indoor (Perennial) Mold Control   Positive indoor molds via skin testing: Aspergillus, Penicillium, Fusarium, Aureobasidium (Pullulara), and Rhizopus  Maintain humidity below 50%. Clean washable surfaces with 5% bleach solution. Remove sources e.g. contaminated carpets.    Control of Dog or Cat Allergen  Avoidance is the best way to manage a dog or cat allergy . If you have a dog or cat and are allergic to dog or cats, consider removing the dog or cat from the home. If you have a dog or cat but don't want to find it a new home, or if your family wants a pet even though someone in the household is allergic, here are some strategies that may help keep symptoms at bay:  Keep the pet out of your bedroom and restrict it to only a few rooms. Be advised that keeping the dog or cat in only one room will not limit the allergens to that room. Don't pet, hug or kiss the dog or cat; if you do, wash your hands with soap and water . High-efficiency particulate air (HEPA) cleaners run continuously in a bedroom or living room can reduce allergen levels over time. Regular use of a high-efficiency vacuum cleaner or a central vacuum can reduce allergen levels. Giving your dog or cat a bath at least once a week can reduce airborne allergen.  Control of Dust Mite Allergen    Dust mites play a major role in allergic asthma and rhinitis.  They  occur in environments with high humidity wherever human skin is found.  Dust mites absorb humidity from the atmosphere (ie, they do not drink) and feed on organic matter (including shed human and animal skin).  Dust mites are a microscopic type of insect that you cannot see with the naked eye.  High levels of dust mites have been detected from mattresses, pillows, carpets, upholstered furniture, bed covers, clothes, soft toys and any woven material.  The principal allergen of the dust mite is found in its feces.  A gram of dust may contain 1,000 mites and 250,000 fecal particles.  Mite antigen is easily measured in the air during house cleaning activities.  Dust mites do not bite and do not cause harm to humans, other than by triggering allergies/asthma.    Ways to decrease your exposure to dust mites in your home:  Encase mattresses, box springs and pillows with a mite-impermeable barrier or cover   Wash sheets, blankets and drapes weekly in hot water  (130 F) with detergent and dry them in a dryer on the hot setting.  Have the room cleaned frequently with a vacuum cleaner and a damp dust-mop.  For carpeting or rugs, vacuuming with a vacuum cleaner equipped with a high-efficiency particulate air (HEPA) filter.  The dust mite allergic individual should not be in a room which is being cleaned and should wait 1 hour after cleaning before going into the room. Do not sleep on upholstered furniture (eg, couches).   If possible removing carpeting, upholstered furniture and drapery from the home is ideal.  Horizontal blinds should be eliminated in the rooms where the person spends the most time (bedroom, study, television room).  Washable vinyl, roller-type shades are optimal. Remove all non-washable stuffed toys from the bedroom.  Wash stuffed toys weekly like sheets and blankets above.   Reduce  indoor humidity to less than 50%.  Inexpensive humidity monitors can be purchased at most hardware stores.  Do not use  a humidifier as can make the problem worse and are not recommended.  Allergy  Shots  Allergies are the result of a chain reaction that starts in the immune system. Your immune system controls how your body defends itself. For instance, if you have an allergy  to pollen, your immune system identifies pollen as an invader or allergen. Your immune system overreacts by producing antibodies called Immunoglobulin E (IgE). These antibodies travel to cells that release chemicals, causing an allergic reaction.  The concept behind allergy  immunotherapy, whether it is received in the form of shots or tablets, is that the immune system can be desensitized to specific allergens that trigger allergy  symptoms. Although it requires time and patience, the payback can be long-term relief. Allergy  injections contain a dilute solution of those substances that you are allergic to based upon your skin testing and allergy  history.   How Do Allergy  Shots Work?  Allergy  shots work much like a vaccine. Your body responds to injected amounts of a particular allergen given in increasing doses, eventually developing a resistance and tolerance to it. Allergy  shots can lead to decreased, minimal or no allergy  symptoms.  There generally are two phases: build-up and maintenance. Build-up often ranges from three to six months and involves receiving injections with increasing amounts of the allergens. The shots are typically given once or twice a week, though more rapid build-up schedules are sometimes used.  The maintenance phase begins when the most effective dose is reached. This dose is different for each person, depending on how allergic you are and your response to the build-up injections. Once the maintenance dose is reached, there are longer periods between injections, typically two to four weeks.  Occasionally doctors give cortisone-type shots that can temporarily reduce allergy  symptoms. These types of shots are different and  should not be confused with allergy  immunotherapy shots.  Who Can Be Treated with Allergy  Shots?  Allergy  shots may be a good treatment approach for people with allergic rhinitis (hay fever), allergic asthma, conjunctivitis (eye allergy ) or stinging insect allergy .   Before deciding to begin allergy  shots, you should consider:   The length of allergy  season and the severity of your symptoms  Whether medications and/or changes to your environment can control your symptoms  Your desire to avoid long-term medication use  Time: allergy  immunotherapy requires a major time commitment  Cost: may vary depending on your insurance coverage  Allergy  shots for children age 73 and older are effective and often well tolerated. They might prevent the onset of new allergen sensitivities or the progression to asthma.  Allergy  shots are not started on patients who are pregnant but can be continued on patients who become pregnant while receiving them. In some patients with other medical conditions or who take certain common medications, allergy  shots may be of risk. It is important to mention other medications you talk to your allergist.   What are the two types of build-ups offered:   RUSH or Rapid Desensitization -- one day of injections lasting from 8:30-4:30pm, injections every 1 hour.  Approximately half of the build-up process is completed in that one day.  The following week, normal build-up is resumed, and this entails ~16 visits either weekly or twice weekly, until reaching your "maintenance dose" which is continued weekly until eventually getting spaced out to every month for a duration of 3 to 5  years. The regular build-up appointments are nurse visits where the injections are administered, followed by required monitoring for 30 minutes.    Traditional build-up -- weekly visits for 6 -12 months until reaching "maintenance dose", then continue weekly until eventually spacing out to every 4 weeks as  above. At these appointments, the injections are administered, followed by required monitoring for 30 minutes.     Either way is acceptable, and both are equally effective. With the rush protocol, the advantage is that less time is spent here for injections overall AND you would also reach maintenance dosing faster (which is when the clinical benefit starts to become more apparent). Not everyone is a candidate for rapid desensitization.   IF we proceed with the RUSH protocol, there are premedications which must be taken the day before and the day after the rush only (this includes antihistamines, steroids, and Singulair ).  After the rush day, no prednisone  or Singulair  is required, and we just recommend antihistamines taken on your injection day.  What Is An Estimate of the Costs?  If you are interested in starting allergy  injections, please check with your insurance company about your coverage for both allergy  vial sets and allergy  injections.  Please do so prior to making the appointment to start injections.  The following are CPT codes to give to your insurance company. These are the amounts we BILL to the insurance company, but the amount YOU WILL PAY and WE RECEIVE IS SUBSTANTIALLY LESS and depends on the contracts we have with different insurance companies.   Amount Billed to Insurance Two allergy  vial set  CPT 95165   $ 2400  Two injections   CPT 95117   $ 40 RUSH (Rapid Desensitization) CPT 95180 x 8 hours  $500/hour  Regarding the allergy  injections, your co-pay may or may not apply with each injection, so please confirm this with your insurance company. When you start allergy  injections, 1 or 2 sets of vials are made based on your allergies.  Not all patients can be on one set of vials. A set of vials lasts 6 months to a year depending on how quickly you can proceed with your build-up of your allergy  injections. Vials are personalized for each patient depending on their specific  allergens.  How often are allergy  injection given during the build-up period?   Injections are given at least weekly during the build-up period until your maintenance dose is achieved. Per the doctor's discretion, you may have the option of getting allergy  injections two times per week during the build-up period. However, there must be at least 48 hours between injections. The build-up period is usually completed within 6-12 months depending on your ability to schedule injections and for adjustments for reactions. When maintenance dose is reached, your injection schedule is gradually changed to every two weeks and later to every three weeks. Injections will then continue every 4 weeks. Usually, injections are continued for a total of 3-5 years.   When Will I Feel Better?  Some may experience decreased allergy  symptoms during the build-up phase. For others, it may take as long as 12 months on the maintenance dose. If there is no improvement after a year of maintenance, your allergist will discuss other treatment options with you.  If you aren't responding to allergy  shots, it may be because there is not enough dose of the allergen in your vaccine or there are missing allergens that were not identified during your allergy  testing. Other reasons could be that  there are high levels of the allergen in your environment or major exposure to non-allergic triggers like tobacco smoke.  What Is the Length of Treatment?  Once the maintenance dose is reached, allergy  shots are generally continued for three to five years. The decision to stop should be discussed with your allergist at that time. Some people may experience a permanent reduction of allergy  symptoms. Others may relapse and a longer course of allergy  shots can be considered.  What Are the Possible Reactions?  The two types of adverse reactions that can occur with allergy  shots are local and systemic. Common local reactions include very mild redness  and swelling at the injection site, which can happen immediately or several hours after. Report a delayed reaction from your last injection. These include arm swelling or runny nose, watery eyes or cough that occurs within 12-24 hours after injection. A systemic reaction, which is less common, affects the entire body or a particular body system. They are usually mild and typically respond quickly to medications. Signs include increased allergy  symptoms such as sneezing, a stuffy nose or hives.   Rarely, a serious systemic reaction called anaphylaxis can develop. Symptoms include swelling in the throat, wheezing, a feeling of tightness in the chest, nausea or dizziness. Most serious systemic reactions develop within 30 minutes of allergy  shots. This is why it is strongly recommended you wait in your doctor's office for 30 minutes after your injections. Your allergist is trained to watch for reactions, and his or her staff is trained and equipped with the proper medications to identify and treat them.   Report to the nurse immediately if you experience any of the following symptoms: swelling, itching or redness of the skin, hives, watery eyes/nose, breathing difficulty, excessive sneezing, coughing, stomach pain, diarrhea, or light headedness. These symptoms may occur within 15-20 minutes after injection and may require medication.   Who Should Administer Allergy  Shots?  The preferred location for receiving shots is your prescribing allergist's office. Injections can sometimes be given at another facility where the physician and staff are trained to recognize and treat reactions, and have received instructions by your prescribing allergist.  What if I am late for an injection?   Injection dose will be adjusted depending upon how many days or weeks you are late for your injection.   What if I am sick?   Please report any illness to the nurse before receiving injections. She may adjust your dose or  postpone injections depending on your symptoms. If you have fever, flu, sinus infection or chest congestion it is best to postpone allergy  injections until you are better. Never get an allergy  injection if your asthma is causing you problems. If your symptoms persist, seek out medical care to get your health problem under control.  What If I am or Become Pregnant:  Women that become pregnant should schedule an appointment with [facility] before receiving any further allergy  injections.

## 2024-09-08 NOTE — Progress Notes (Signed)
 FOLLOW UP  Date of Service/Encounter:  09/08/24   Assessment:   Chronic rhinitis - planning for skin testing at the next visit   Intermittent asthma - on albuterol  as needed (rarely uses)    ADHD - on Strattera   Plan/Recommendations:   1. Chronic rhinitis - Testing today showed: grasses, ragweed, weeds, trees, indoor molds, outdoor molds, dust mites, cat, dog, and horse - Copy of test results provided.  - Avoidance measures provided. - Stop taking: current medications - Start taking: Xyzal  (levocetirizine) 5mg  tablet once daily and Singulair  (montelukast ) 10mg  daily - You can use an extra dose of the antihistamine, if needed, for breakthrough symptoms.  - Consider nasal saline rinses 1-2 times daily to remove allergens from the nasal cavities as well as help with mucous clearance (this is especially helpful to do before the nasal sprays are given) - Consider allergy  shots as a means of long-term control. - Allergy  shots re-train and reset the immune system to ignore environmental allergens and decrease the resulting immune response to those allergens (sneezing, itchy watery eyes, runny nose, nasal congestion, etc).    - Allergy  shots improve symptoms in 75-85% of patients.  - We can discuss more at the next appointment if the medications are not working for you.  2. Return in about 6 weeks (around 10/20/2024). You can have the follow up appointment with Dr. Iva or a Nurse Practicioner (our Nurse Practitioners are excellent and always have Physician oversight!).    Subjective:   Amy Durham is a 48 y.o. female presenting today for follow up of  Chief Complaint  Patient presents with   Allergy  Testing    1-55    Amy Durham has a history of the following: Patient Active Problem List   Diagnosis Date Noted   Hypertension, benign essential, goal below 140/90 01/14/2023   Hypercholesteremia 01/14/2023   Recurrent sinusitis 10/21/2022   Snoring 10/21/2022    Common bile duct stone     History obtained from: chart review and patient.  Discussed the use of AI scribe software for clinical note transcription with the patient and/or guardian, who gave verbal consent to proceed.  Amy Durham is a 48 y.o. female presenting for skin testing. She was last seen on October 31st. We could not do testing because her insurance company does not cover testing on the same day as a New Patient visit. She has been off of all antihistamines 3 days in anticipation of the testing.   At that visit, we decided to do environmental allergy  testing.  She was alternating between Allegra and Zyrtec .  She has no spray that she uses when she is really bad.  Asthma was under good control with albuterol  as needed.  Otherwise, there have been no changes to her past medical history, surgical history, family history, or social history.    Review of systems otherwise negative other than that mentioned in the HPI.    Objective:   There were no vitals taken for this visit. There is no height or weight on file to calculate BMI.    Physical exam deferred since this was a skin testing appointment only.   Diagnostic studies:    Allergy  Studies:     Airborne Adult Perc - 09/08/24 1341     Time Antigen Placed 1341    Allergen Manufacturer Jestine    Location Back    Number of Test 55    1. Control-Buffer 50% Glycerol Negative    2. Control-Histamine 3+  3. Bahia 2+    4. Bermuda Negative    5. Johnson Negative    6. Kentucky  Blue Negative    7. Meadow Fescue Negative    8. Perennial Rye 2+    9. Timothy Negative    10. Ragweed Mix Negative    11. Cocklebur Negative    12. Plantain,  English Negative    13. Baccharis Negative    14. Dog Fennel 2+    15. Russian Thistle Negative    16. Lamb's Quarters Negative    17. Sheep Sorrell Negative    18. Rough Pigweed Negative    19. Marsh Elder, Rough Negative    20. Mugwort, Common Negative    21. Box, Elder Negative     22. Cedar, red 2+    23. Sweet Gum 2+    24. Pecan Pollen 2+    25. Pine Mix Negative    26. Walnut, Black Pollen Negative    27. Red Mulberry Negative    28. Ash Mix Negative    29. Birch Mix Negative    30. Beech American Negative    31. Cottonwood, Eastern Negative    32. Hickory, White Negative    33. Maple Mix Negative    34. Oak, Eastern Mix Negative    35. Sycamore Eastern Negative    36. Alternaria Alternata Negative    37. Cladosporium Herbarum Negative    38. Aspergillus Mix Negative    39. Penicillium Mix Negative    40. Bipolaris Sorokiniana (Helminthosporium) Negative    41. Drechslera Spicifera (Curvularia) Negative    42. Mucor Plumbeus Negative    43. Fusarium Moniliforme Negative    44. Aureobasidium Pullulans (pullulara) Negative    45. Rhizopus Oryzae Negative    46. Botrytis Cinera Negative    47. Epicoccum Nigrum Negative    48. Phoma Betae Negative    49. Dust Mite Mix Negative    50. Cat Hair 10,000 BAU/ml Negative    51.  Dog Epithelia 2+    52. Mixed Feathers Negative    53. Horse Epithelia 2+    54. Cockroach, German Negative    55. Tobacco Leaf Negative          Intradermal - 09/08/24 1408     Time Antigen Placed 1415    Allergen Manufacturer Jestine    Location Arm    Number of Test 12    Control Negative    Bermuda Negative    Johnson 2+    Ragweed Mix 3+    Weed Mix 3+    Mold 1 3+    Mold 2 3+    Mold 3 3+    Mold 4 4+    Mite Mix 3+    Cat 3+    Cockroach Negative             Allergy  testing results were read and interpreted by myself, documented by clinical staff.      Marty Shaggy, MD  [facility] 

## 2024-09-11 ENCOUNTER — Encounter: Payer: Self-pay | Admitting: Allergy & Immunology

## 2024-09-18 DIAGNOSIS — G4733 Obstructive sleep apnea (adult) (pediatric): Secondary | ICD-10-CM | POA: Diagnosis not present

## 2024-09-20 ENCOUNTER — Other Ambulatory Visit (HOSPITAL_COMMUNITY): Payer: Self-pay | Admitting: Internal Medicine

## 2024-09-20 DIAGNOSIS — Z1231 Encounter for screening mammogram for malignant neoplasm of breast: Secondary | ICD-10-CM

## 2024-09-29 ENCOUNTER — Ambulatory Visit (HOSPITAL_COMMUNITY)
Admission: RE | Admit: 2024-09-29 | Discharge: 2024-09-29 | Disposition: A | Discharge status: Home / Self Care | Source: Ambulatory Visit | Attending: Internal Medicine | Admitting: Internal Medicine

## 2024-09-29 ENCOUNTER — Encounter (HOSPITAL_COMMUNITY): Payer: Self-pay

## 2024-09-29 DIAGNOSIS — Z1231 Encounter for screening mammogram for malignant neoplasm of breast: Secondary | ICD-10-CM | POA: Diagnosis not present

## 2024-10-03 ENCOUNTER — Encounter: Payer: Self-pay | Admitting: Allergy & Immunology

## 2024-10-04 ENCOUNTER — Inpatient Hospital Stay
Admission: RE | Admit: 2024-10-04 | Discharge: 2024-10-04 | Disposition: A | Discharge status: Home / Self Care | Payer: Self-pay | Source: Ambulatory Visit | Attending: Internal Medicine | Admitting: Internal Medicine

## 2024-10-04 ENCOUNTER — Other Ambulatory Visit (HOSPITAL_COMMUNITY): Payer: Self-pay | Admitting: Internal Medicine

## 2024-10-04 DIAGNOSIS — Z1231 Encounter for screening mammogram for malignant neoplasm of breast: Secondary | ICD-10-CM

## 2024-10-19 ENCOUNTER — Other Ambulatory Visit: Payer: Self-pay

## 2024-10-20 ENCOUNTER — Encounter: Payer: Self-pay | Admitting: Family Medicine

## 2024-10-20 ENCOUNTER — Other Ambulatory Visit: Payer: Self-pay

## 2024-10-20 ENCOUNTER — Ambulatory Visit: Admitting: Family Medicine

## 2024-10-20 VITALS — BP 118/76 | HR 76 | Temp 98.9°F

## 2024-10-20 DIAGNOSIS — J302 Other seasonal allergic rhinitis: Secondary | ICD-10-CM

## 2024-10-20 DIAGNOSIS — J3089 Other allergic rhinitis: Secondary | ICD-10-CM | POA: Diagnosis not present

## 2024-10-20 DIAGNOSIS — J4520 Mild intermittent asthma, uncomplicated: Secondary | ICD-10-CM | POA: Diagnosis not present

## 2024-10-20 MED ORDER — RYALTRIS 665-25 MCG/ACT NA SUSP: 2.0000 | Freq: Two times a day (BID) | NASAL | 5 refills | Status: AC | PRN

## 2024-10-20 NOTE — Patient Instructions (Addendum)
 Asthma Continue albuterol  2 puffs once every 4 hours if needed for cough or wheeze You may use the albuterol  2 puffs 5 to 15 minutes before activity to decrease cough or wheeze  Allergic rhinitis Continue allergen avoidance measures directed toward grass pollen, weed pollen, ragweed pollen, tree pollen, indoor mold, outdoor mold, dust mite, cat, dog, and horse as listed below Begin Ryaltris  2 sprays in each nostril up to twice a day for nasal issues. If not well covered call the clinic and we will try for Dymista nasal spray. Continue levocetirizine 5 mg once a day if needed for runny nose or itch.  You may use an additional dose of levocetirizine 5 mg once a day if needed for breakthrough symptoms Consider saline nasal rinses as needed for nasal symptoms. Use this before any medicated nasal sprays for best result. Written information provided  Call the clinic if this treatment plan is not working well for you.  Follow up in 6 months or sooner if needed.  Reducing Pollen Exposure The American Academy of Allergy , Asthma and Immunology suggests the following steps to reduce your exposure to pollen during allergy  seasons. Do not hang sheets or clothing out to dry; pollen may collect on these items. Do not mow lawns or spend time around freshly cut grass; mowing stirs up pollen. Keep windows closed at night.  Keep car windows closed while driving. Minimize morning activities outdoors, a time when pollen counts are usually at their highest. Stay indoors as much as possible when pollen counts or humidity is high and on windy days when pollen tends to remain in the air longer. Use air conditioning when possible.  Many air conditioners have filters that trap the pollen spores. Use a HEPA room air filter to remove pollen form the indoor air you breathe.    Control of Mold Allergen Mold and fungi can grow on a variety of surfaces provided certain temperature and moisture conditions exist.  Outdoor  molds grow on plants, decaying vegetation and soil.  The major outdoor mold, Alternaria and Cladosporium, are found in very high numbers during hot and dry conditions.  Generally, a late Summer - Fall peak is seen for common outdoor fungal spores.  Rain will temporarily lower outdoor mold spore count, but counts rise rapidly when the rainy period ends.  The most important indoor molds are Aspergillus and Penicillium.  Dark, humid and poorly ventilated basements are ideal sites for mold growth.  The next most common sites of mold growth are the bathroom and the kitchen.  Outdoor [employer] Use air conditioning and keep windows closed Avoid exposure to decaying vegetation. Avoid leaf raking. Avoid grain handling. Consider wearing a face mask if working in moldy areas.  Indoor Mold Control Maintain humidity below 50%. Clean washable surfaces with 5% bleach solution. Remove sources e.g. Contaminated carpets.  Control of Dust Mite Allergen Dust mites play a major role in allergic asthma and rhinitis. They occur in environments with high humidity wherever human skin is found. Dust mites absorb humidity from the atmosphere (ie, they do not drink) and feed on organic matter (including shed human and animal skin). Dust mites are a microscopic type of insect that you cannot see with the naked eye. High levels of dust mites have been detected from mattresses, pillows, carpets, upholstered furniture, bed covers, clothes, soft toys and any woven material. The principal allergen of the dust mite is found in its feces. A gram of dust may contain 1,000 mites and 250,000  fecal particles. Mite antigen is easily measured in the air during house cleaning activities. Dust mites do not bite and do not cause harm to humans, other than by triggering allergies/asthma.  Ways to decrease your exposure to dust mites in your home:  1. Encase mattresses, box springs and pillows with a mite-impermeable barrier or cover 2.  Wash sheets, blankets and drapes weekly in hot water  (130 F) with detergent and dry them in a dryer on the hot setting. 3. Have the room cleaned frequently with a vacuum cleaner and a damp dust-mop. For carpeting or rugs, vacuuming with a vacuum cleaner equipped with a high-efficiency particulate air (HEPA) filter. The dust mite allergic individual should not be in a room which is being cleaned and should wait 1 hour after cleaning before going into the room. 4. Do not sleep on upholstered furniture (eg, couches). 5. If possible removing carpeting, upholstered furniture and drapery from the home is ideal. Horizontal blinds should be eliminated in the rooms where the person spends the most time (bedroom, study, television room). Washable vinyl, roller-type shades are optimal. 6. Remove all non-washable stuffed toys from the bedroom. Wash stuffed toys weekly like sheets and blankets above. 7. Reduce indoor humidity to less than 50%. Inexpensive humidity monitors can be purchased at most hardware stores. Do not use a humidifier as can make the problem worse and are not recommended.  Control of Dog or Cat Allergen Avoidance is the best way to manage a dog or cat allergy . If you have a dog or cat and are allergic to dog or cats, consider removing the dog or cat from the home. If you have a dog or cat but dont want to find it a new home, or if your family wants a pet even though someone in the household is allergic, here are some strategies that may help keep symptoms at bay:  Keep the pet out of your bedroom and restrict it to only a few rooms. Be advised that keeping the dog or cat in only one room will not limit the allergens to that room. Dont pet, hug or kiss the dog or cat; if you do, wash your hands with soap and water . High-efficiency particulate air (HEPA) cleaners run continuously in a bedroom or living room can reduce allergen levels over time. Regular use of a high-efficiency vacuum cleaner or  a central vacuum can reduce allergen levels. Giving your dog or cat a bath at least once a week can reduce airborne allergen.

## 2024-10-20 NOTE — Progress Notes (Signed)
 ' +  [address] [place] [address] Dept: [phone]  FOLLOW UP NOTE  Patient ID: Amy Durham, female    DOB: 1975-11-04  Age: 49 y.o. MRN: [id number] Date of Office Visit: 10/20/2024  Assessment  Chief Complaint: Seasonal and perennial allergic rhinitis and Follow-up (Sinus is acting up today.)  HPI Amy Durham is a 49 year old female who presents to the clinic for follow-up visit.  She was last seen in this clinic on 11/10/2023 by Dr. Iva for evaluation of asthma, and allergic rhinitis.   Discussed the use of AI scribe software for clinical note transcription with the patient, who gave verbal consent to proceed. History of Present Illness Amy Durham is a 49 year old female who presents with worsening sinus symptoms.  She experiences worsening sinus symptoms, including rhinorrhea and nasal congestion that began during the night. Sneezing occurs almost every morning, particularly at work. She also experiences postnasal drip, noticeable in various settings such as the food line and baseball field.  Current medications include Sinex nasal spray, which she picked up yesterday after misplacing her previous one, providing intermittent relief. She was previously on Singulair  (montelukast ) but discontinued it due to mood changes noted by her children around Christmas. While Singulair  was effective for allergy  symptoms, the mood side effects were significant. She has been taking two doses of antihistamine but finds it ineffective. Levocetirizine (Xyzal ) is also reported as not effective. Her last environmental allergy  skin testing on 11/10/2023 was positive to grass pollen, weed pollen, ragweed pollen, tree pollen, indoor mold, outdoor mold, dust mite, cat, dog, and horse. We discussed the risks and benefits of allergen immunotherapy as well as length of treatment.  No shortness of breath or wheezing, but she mentions a tickle in her throat that occasionally leads  to coughing. She feels mucus is present but is unable to expectorate it. She has not used albuterol  since her last visit to this clinic.  Her social history includes working at [employer], where she suspects environmental factors may be contributing to her symptoms. She recently experienced emotional stress due to the illness and subsequent euthanasia of her dog, which may have compounded her mood changes while on Singulair .  Her current medications are listed in the chart.  Drug Allergies:  Allergies[1]  Physical Exam: BP 118/76 (BP Location: Left Arm, Patient Position: Sitting, Cuff Size: Normal)   Pulse 76   Temp 98.9 F (37.2 C) (Temporal)   SpO2 97%    Physical Exam Vitals reviewed.  Constitutional:      Appearance: Normal appearance.  HENT:     Head: Normocephalic and atraumatic.     Right Ear: Tympanic membrane normal.     Left Ear: Tympanic membrane normal.     Nose:     Comments: Bilateral nares slightly erythematous with thin cleaar nasal drainage noted. Pharynx normal. Ears normal. Eyes normal.     Mouth/Throat:     Pharynx: Oropharynx is clear.  Eyes:     Conjunctiva/sclera: Conjunctivae normal.  Cardiovascular:     Rate and Rhythm: Normal rate and regular rhythm.     Heart sounds: Normal heart sounds. No murmur heard. Pulmonary:     Effort: Pulmonary effort is normal.     Breath sounds: Normal breath sounds.     Comments: Lungs clear to auscultation Musculoskeletal:        General: Normal range of motion.     Cervical back: Normal range of motion and neck supple.  Skin:    General: Skin is warm and dry.  Neurological:     Mental Status: She is alert and oriented to person, place, and time.  Psychiatric:        Mood and Affect: Mood normal.        Behavior: Behavior normal.        Thought Content: Thought content normal.        Judgment: Judgment normal.    Assessment and Plan: No diagnosis found.  Meds ordered this encounter  Medications    Olopatadine-Mometasone (RYALTRIS ) 665-25 MCG/ACT SUSP    Sig: Place 2 sprays into the nose 2 (two) times daily as needed.    Dispense:  29 g    Refill:  5    Patient Instructions  Asthma Continue albuterol  2 puffs once every 4 hours if needed for cough or wheeze You may use the albuterol  2 puffs 5 to 15 minutes before activity to decrease cough or wheeze  Allergic rhinitis Continue allergen avoidance measures directed toward grass pollen, weed pollen, ragweed pollen, tree pollen, indoor mold, outdoor mold, dust mite, cat, dog, and horse as listed below Begin Ryaltris  2 sprays in each nostril up to twice a day for nasal issues. If not well covered call the clinic and we will try for Dymista nasal spray. Continue levocetirizine 5 mg once a day if needed for runny nose or itch.  You may use an additional dose of  evoetirizine 5 mg once a day if needed for breakthrough symptoms Consider saline nasal rinses as needed for nasal symptoms. Use this before any medicated nasal sprays for best result. Written information provided  Call the clinic if this treatment plan is not working well for you.  Follow up in 6 months or sooner if needed.  Return in about 6 months (around 04/19/2025), or if symptoms worsen or fail to improve.    Thank you for the opportunity to care for this patient.  Please do not hesitate to contact me with questions.  Arlean Mutter, FNP [facility]          [1] No Known Allergies

## 2024-10-22 ENCOUNTER — Encounter: Payer: Self-pay | Admitting: Family Medicine

## 2024-10-22 DIAGNOSIS — J4520 Mild intermittent asthma, uncomplicated: Secondary | ICD-10-CM | POA: Insufficient documentation

## 2024-10-22 DIAGNOSIS — J302 Other seasonal allergic rhinitis: Secondary | ICD-10-CM | POA: Insufficient documentation

## 2024-11-15 ENCOUNTER — Ambulatory Visit: Admitting: Physician Assistant

## 2025-04-27 ENCOUNTER — Ambulatory Visit: Admitting: Allergy & Immunology
